# Patient Record
Sex: Female | Born: 1990 | Race: White | Hispanic: No | Smoking: Never smoker
Health system: Southern US, Community
[De-identification: ages and names within clinical notes are randomized; demographics above are authoritative.]

## PROBLEM LIST (undated history)

## (undated) DIAGNOSIS — Q740 Other congenital malformations of upper limb(s), including shoulder girdle: Secondary | ICD-10-CM

## (undated) DIAGNOSIS — K219 Gastro-esophageal reflux disease without esophagitis: Secondary | ICD-10-CM

## (undated) DIAGNOSIS — F419 Anxiety disorder, unspecified: Secondary | ICD-10-CM

## (undated) DIAGNOSIS — F32A Depression, unspecified: Secondary | ICD-10-CM

## (undated) DIAGNOSIS — E119 Type 2 diabetes mellitus without complications: Secondary | ICD-10-CM

## (undated) DIAGNOSIS — M1990 Unspecified osteoarthritis, unspecified site: Secondary | ICD-10-CM

## (undated) DIAGNOSIS — D649 Anemia, unspecified: Secondary | ICD-10-CM

## (undated) DIAGNOSIS — Z5189 Encounter for other specified aftercare: Secondary | ICD-10-CM

## (undated) HISTORY — PX: TOE SURGERY: SHX1073

## (undated) HISTORY — DX: Type 2 diabetes mellitus without complications: E11.9

## (undated) HISTORY — DX: Encounter for other specified aftercare: Z51.89

## (undated) HISTORY — DX: Anxiety disorder, unspecified: F41.9

## (undated) HISTORY — PX: TUBAL LIGATION: SHX77

## (undated) HISTORY — DX: Depression, unspecified: F32.A

## (undated) HISTORY — PX: HAND SURGERY: SHX662

## (undated) HISTORY — DX: Gastro-esophageal reflux disease without esophagitis: K21.9

## (undated) HISTORY — DX: Unspecified osteoarthritis, unspecified site: M19.90

## (undated) HISTORY — PX: COLONOSCOPY: SHX174

## (undated) HISTORY — PX: ESOPHAGOGASTRODUODENOSCOPY: SHX1529

---
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
## 2017-10-31 DIAGNOSIS — D509 Iron deficiency anemia, unspecified: Secondary | ICD-10-CM | POA: Insufficient documentation

## 2018-05-21 ENCOUNTER — Other Ambulatory Visit: Payer: Self-pay

## 2018-05-21 ENCOUNTER — Ambulatory Visit (HOSPITAL_BASED_OUTPATIENT_CLINIC_OR_DEPARTMENT_OTHER)
Admission: RE | Admit: 2018-05-21 | Discharge: 2018-05-21 | Disposition: A | Discharge status: Home / Self Care | Payer: BLUE CROSS/BLUE SHIELD | Source: Ambulatory Visit | Attending: Family Medicine | Admitting: Family Medicine

## 2018-05-21 ENCOUNTER — Encounter (HOSPITAL_BASED_OUTPATIENT_CLINIC_OR_DEPARTMENT_OTHER): Payer: Self-pay

## 2018-05-21 ENCOUNTER — Encounter: Payer: Self-pay | Admitting: *Deleted

## 2018-05-21 ENCOUNTER — Telehealth: Payer: Self-pay | Admitting: *Deleted

## 2018-05-21 ENCOUNTER — Emergency Department (INDEPENDENT_AMBULATORY_CARE_PROVIDER_SITE_OTHER)
Admission: EM | Admit: 2018-05-21 | Discharge: 2018-05-21 | Disposition: A | Discharge status: Home / Self Care | Payer: BLUE CROSS/BLUE SHIELD | Source: Home / Self Care | Attending: Family Medicine | Admitting: Family Medicine

## 2018-05-21 DIAGNOSIS — B9689 Other specified bacterial agents as the cause of diseases classified elsewhere: Secondary | ICD-10-CM

## 2018-05-21 DIAGNOSIS — R1031 Right lower quadrant pain: Secondary | ICD-10-CM | POA: Diagnosis not present

## 2018-05-21 DIAGNOSIS — N760 Acute vaginitis: Secondary | ICD-10-CM | POA: Diagnosis not present

## 2018-05-21 DIAGNOSIS — B3731 Acute candidiasis of vulva and vagina: Secondary | ICD-10-CM

## 2018-05-21 DIAGNOSIS — B373 Candidiasis of vulva and vagina: Secondary | ICD-10-CM | POA: Diagnosis not present

## 2018-05-21 DIAGNOSIS — D251 Intramural leiomyoma of uterus: Secondary | ICD-10-CM | POA: Diagnosis not present

## 2018-05-21 DIAGNOSIS — K219 Gastro-esophageal reflux disease without esophagitis: Secondary | ICD-10-CM | POA: Diagnosis not present

## 2018-05-21 HISTORY — DX: Other congenital malformations of upper limb(s), including shoulder girdle: Q74.0

## 2018-05-21 HISTORY — DX: Anemia, unspecified: D64.9

## 2018-05-21 LAB — POCT URINALYSIS DIP (MANUAL ENTRY)
Bilirubin, UA: NEGATIVE
Blood, UA: NEGATIVE
Glucose, UA: NEGATIVE mg/dL
Ketones, POC UA: NEGATIVE mg/dL
Nitrite, UA: NEGATIVE
Protein Ur, POC: NEGATIVE mg/dL
Spec Grav, UA: >=1.03 — AB
Urobilinogen, UA: 0.2 U/dL
pH, UA: 5

## 2018-05-21 LAB — POCT CBC W AUTO DIFF (K'VILLE URGENT CARE)

## 2018-05-21 LAB — POCT URINE PREGNANCY: Preg Test, Ur: NEGATIVE

## 2018-05-21 MED ORDER — METRONIDAZOLE 500 MG PO TABS: ORAL_TABLET | ORAL | 0 refills | Status: DC

## 2018-05-21 MED ORDER — FLUCONAZOLE 150 MG PO TABS: ORAL_TABLET | ORAL | 1 refills | Status: DC

## 2018-05-21 NOTE — Discharge Instructions (Addendum)
May take Pepcid [facility] or Prilosec OTC daily for reflux symptoms.

## 2018-05-21 NOTE — ED Triage Notes (Signed)
Pt c/o RLQ abd pain since her last period and  amenorrhea x 11 days. She also c/o vaginal itching and heart burn. Denies concern for STD and denies dysuria.

## 2018-05-21 NOTE — Telephone Encounter (Signed)
Attempted to call with [place] results. NA and VM is not set up. Per Dr. Assunta Found [place] did not show anything acute to explain her s/s and her WBC was normal. Showed small fibroid in the Uterus. She should f/u with her PCP for further evaluation of her s/s.

## 2018-05-21 NOTE — ED Provider Notes (Signed)
[facility]    CSN: [id number] Arrival date & time: 05/21/18  0859     History   Chief Complaint Chief Complaint  Patient presents with  . Abdominal Pain  . Amenorrhea    HPI Jill Butler is a 28 y.o. female.   Patient presents with several problems: 1)  She complains of right lower quadrant pain present for about 11 days.  Her last normal period was on 04/13/18, and lasted 6 days.  At the beginning of her last period she developed right lower quadrant pain radiating to her right flank with abdominal bloating.  The pain gradually decreased to a lower intensity as her period resolved, but has remained constant.  Her next period, which did not occur, was due 11 days ago.  However, at that time her abdominal pain increased to a similar intensity as in December, then gradually decreased to a constant low level.  The pain is worse during physical activity, and awakens her at night.  She denies urinary symptoms.  There have been no changes in her bowel movements.  She denies vaginal discharge. She denies fevers, chills, and sweats, and does not feel ill. She has had little improvement with Motrin and Tylenol. She has a history of bilateral tubal ligation. 2)  Patient complains of increased heartburn for 3 months.  Her symptoms consist of frequent "burping" and upper esophageal discomfort.  She admits that she awakens with a cough.  Her symptoms improve slightly with Tums. 3)  She complains of vaginal itching without discharge or external rash.  She states that she was treated for a vaginal yeast infection 3 months ago.  The history is provided by the patient.  Abdominal Pain  Pain location:  RLQ Pain quality: aching, bloating and pressure   Pain radiates to:  R flank Pain severity:  Mild Onset quality:  Gradual Duration:  4 weeks Timing:  Constant Progression:  Waxing and waning Chronicity:  Recurrent Context: awakening from sleep and previous surgery   Context: not  alcohol use, not diet changes, not eating, not medication withdrawal, not recent illness, not recent travel, not sick contacts, not suspicious food intake and not trauma   Relieved by:  Nothing Worsened by:  Movement, palpation and coughing Ineffective treatments:  NSAIDs and acetaminophen Associated symptoms: belching   Associated symptoms: no anorexia, no chest pain, no chills, no constipation, no cough, no diarrhea, no dysuria, no fatigue, no fever, no flatus, no hematemesis, no hematochezia, no hematuria, no melena, no nausea, no shortness of breath, no sore throat, no vaginal bleeding, no vaginal discharge and no vomiting   Risk factors: multiple surgeries and obesity   Risk factors: no alcohol abuse and not pregnant     Past Medical History:  Diagnosis Date  . Anemia   . Congenital clinodactyly     Patient Active Problem List   Diagnosis Date Noted  . Iron deficiency anemia, unspecified 10/31/2017  . 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 CESAREAN SECTION     x 2  . COLONOSCOPY    . ESOPHAGOGASTRODUODENOSCOPY    . HAND SURGERY Left    3rd digit  . TOE SURGERY Right    Right great toe  . TUBAL LIGATION      OB History    Gravida  2   Para  2   Term  2   Preterm      AB      Living  2     SAB  0   TAB  0   Ectopic  0   Multiple  0   Live Births  2            Home Medications    Prior to Admission medications   Medication Sig Start Date End Date Taking? Authorizing Provider  fluconazole (DIFLUCAN) 150 MG tablet Take one tab by mouth as a single dose.  May repeat in 72 hours if needed. 05/21/18   Kandra Nicolas, MD  metroNIDAZOLE (FLAGYL) 500 MG tablet Take one tab by mouth every 12 hours for 7 days. 05/21/18   Kandra Nicolas, MD    Family History Family History  Problem Relation Age of Onset  . Thyroid disease Mother   . Scoliosis Mother   . Diabetes Father   . Hyperlipidemia Father    . Hypertension Father     Social History Social History   Tobacco Use  . Smoking status: Never Smoker  . Smokeless tobacco: Never Used  Substance Use Topics  . Alcohol use: Never    Frequency: Never  . Drug use: Never     Allergies   Meloxicam; Norethindron-ethinyl estrad-fe; Other; Sulfa antibiotics; Sulfamethoxazole; and Tramadol   Review of Systems Review of Systems  Constitutional: Negative for activity change, appetite change, chills, diaphoresis, fatigue, fever and unexpected weight change.  HENT: Negative for congestion and sore throat.   Eyes: Negative.   Respiratory: Negative for cough and shortness of breath.   Cardiovascular: Negative for chest pain and leg swelling.  Gastrointestinal: Positive for abdominal pain. Negative for anorexia, blood in stool, constipation, diarrhea, flatus, hematemesis, hematochezia, melena, nausea and vomiting.       Heartburn  Genitourinary: Negative for dysuria, frequency, genital sores, hematuria, pelvic pain, urgency, vaginal bleeding and vaginal discharge.  Musculoskeletal: Negative.   Skin: Negative.   Neurological: Negative for headaches.  Hematological: Negative.      Physical Exam Triage Vital Signs ED Triage Vitals [05/21/18 0940]  Enc Vitals Group     BP 117/75     Pulse Rate 67     Resp 18     Temp 98 F (36.7 C)     Temp Source Oral     SpO2 99 %     Weight 204 lb (92.5 kg)     Height 5' (1.524 m)     Head Circumference      Peak Flow      Pain Score 0     Pain Loc      Pain Edu?      Excl. in [place]?    No data found.  Updated Vital Signs BP 117/75 (BP Location: Right Arm)   Pulse 67   Temp 98 F (36.7 C) (Oral)   Resp 18   Ht 5' (1.524 m)   Wt 92.5 kg   LMP 04/13/2018   SpO2 99%   BMI 39.84 kg/m   Visual Acuity Right Eye Distance:   Left Eye Distance:   Bilateral Distance:    Right Eye Near:   Left Eye Near:    Bilateral Near:     Physical Exam Vitals signs and nursing note reviewed.   Constitutional:      General: She is not in acute distress.    Appearance: She is obese. She is not ill-appearing.  HENT:     Head: Normocephalic.     Mouth/Throat:     Mouth: Mucous membranes are moist.  Eyes:  Extraocular Movements: Extraocular movements intact.  Neck:     Musculoskeletal: Neck supple.  Cardiovascular:     Heart sounds: Normal heart sounds.  Pulmonary:     Breath sounds: Normal breath sounds.  Abdominal:     General: Abdomen is protuberant. Bowel sounds are normal. There is no distension.     Palpations: Abdomen is soft. There is no hepatomegaly or splenomegaly.     Tenderness: There is abdominal tenderness in the right lower quadrant. There is no right CVA tenderness, guarding or rebound. Positive signs include McBurney's sign. Negative signs include psoas sign and obturator sign.     Hernia: No hernia is present.    Musculoskeletal:     Right lower leg: No edema.     Left lower leg: No edema.  Lymphadenopathy:     Cervical: No cervical adenopathy.  Skin:    General: Skin is warm and dry.     Findings: No rash.  Neurological:     Mental Status: She is alert and oriented to person, place, and time.      UC Treatments / Results  Labs (all labs ordered are listed, but only abnormal results are displayed) Labs Reviewed  POCT URINALYSIS DIP (MANUAL ENTRY) - Abnormal; Notable for the following components:      Result Value   Clarity, UA cloudy (*)    Spec Grav, UA >=1.030 (*)    Leukocytes, UA Small (1+) (*)    All other components within normal limits  POCT WET + KOH PREP:  Negative trich, negative WBC, Positive yeast, epithelial cells present, Positive clue cells  POCT URINE PREGNANCY Negative  POCT CBC W AUTO DIFF ([facility]):  WBC 7.4; LY 33.6; MO 1.6; GR 64.8; Hgb 14.6; Platelets 284     EKG None  Radiology No results found.  Procedures Procedures (including critical care time)  Medications Ordered in UC Medications - No  data to display  Initial Impression / Assessment and Plan / UC Course  I have reviewed the triage vital signs and the nursing notes.  Pertinent labs & imaging results that were available during my care of the patient were reviewed by me and considered in my medical decision making (see chart for details).    Normal CBC, and negative pregnancy test reassuring.  Acute appendicitis unlikely. Schedule pelvic u/s Rx Flagyl and single dose Diflucan  Followup with PCP  Final Clinical Impressions(s) / UC Diagnoses   Final diagnoses:  Gastroesophageal reflux disease, esophagitis presence not specified  BV (bacterial vaginosis)  Candida vaginitis  Right lower quadrant abdominal pain     Discharge Instructions     May take Pepcid [facility] or Prilosec OTC daily for reflux symptoms.    ED Prescriptions    Medication Sig Dispense Auth. Provider   metroNIDAZOLE (FLAGYL) 500 MG tablet Take one tab by mouth every 12 hours for 7 days. 14 tablet Kandra Nicolas, MD   fluconazole (DIFLUCAN) 150 MG tablet Take one tab by mouth as a single dose.  May repeat in 72 hours if needed. 1 tablet Kandra Nicolas, MD         Kandra Nicolas, MD 05/21/18 [phone]

## 2018-05-22 NOTE — Telephone Encounter (Signed)
Patient informed of u/s results.  Patient voices understanding.

## 2018-08-23 DIAGNOSIS — F411 Generalized anxiety disorder: Secondary | ICD-10-CM | POA: Insufficient documentation

## 2018-08-23 DIAGNOSIS — F332 Major depressive disorder, recurrent severe without psychotic features: Secondary | ICD-10-CM | POA: Insufficient documentation

## 2018-08-24 DIAGNOSIS — E119 Type 2 diabetes mellitus without complications: Secondary | ICD-10-CM | POA: Insufficient documentation

## 2018-08-24 DIAGNOSIS — Z6838 Body mass index (BMI) 38.0-38.9, adult: Secondary | ICD-10-CM | POA: Insufficient documentation

## 2018-08-25 ENCOUNTER — Ambulatory Visit (HOSPITAL_COMMUNITY): Payer: BLUE CROSS/BLUE SHIELD | Admitting: Psychiatry

## 2018-08-25 MED ORDER — PAROXETINE HCL 20 MG PO TABS
20.00 | ORAL_TABLET | ORAL | Status: DC
Start: 2018-08-25 — End: 2018-08-25

## 2018-08-25 MED ORDER — SALINE NASAL SPRAY 0.65 % NA SOLN
2.00 | NASAL | Status: DC
Start: ? — End: 2018-08-25

## 2018-08-25 MED ORDER — DEXTROMETHORPHAN-GUAIFENESIN 10-100 MG/5ML PO SYRP
10.00 | ORAL_SOLUTION | ORAL | Status: DC
Start: ? — End: 2018-08-25

## 2018-08-25 MED ORDER — LOPERAMIDE HCL 2 MG PO CAPS
2.00 | ORAL_CAPSULE | ORAL | Status: DC
Start: ? — End: 2018-08-25

## 2018-08-25 MED ORDER — POLYVINYL ALCOHOL-POVIDONE PF 1.4-0.6 % OP SOLN
2.00 | OPHTHALMIC | Status: DC
Start: ? — End: 2018-08-25

## 2018-08-25 MED ORDER — NITROFURANTOIN MONOHYD MACRO 100 MG PO CAPS
100.00 | ORAL_CAPSULE | ORAL | Status: DC
Start: 2018-08-25 — End: 2018-08-25

## 2018-08-25 MED ORDER — ANTACID 311-232 MG OR CAPS
1.00 | ORAL_CAPSULE | ORAL | Status: DC
Start: ? — End: 2018-08-25

## 2018-08-25 MED ORDER — THERA PO TABS
1.00 | ORAL_TABLET | ORAL | Status: DC
Start: 2018-08-25 — End: 2018-08-25

## 2018-08-25 MED ORDER — ALUM & MAG HYDROXIDE-SIMETH 200-200-20 MG/5ML PO SUSP
30.00 | ORAL | Status: DC
Start: ? — End: 2018-08-25

## 2018-08-25 MED ORDER — GENERIC EXTERNAL MEDICATION
Status: DC
Start: ? — End: 2018-08-25

## 2018-08-25 MED ORDER — HYDROXYZINE PAMOATE 50 MG PO CAPS
50.00 | ORAL_CAPSULE | ORAL | Status: DC
Start: ? — End: 2018-08-25

## 2018-08-25 MED ORDER — POLYETHYLENE GLYCOL 3350 17 G PO PACK
17.00 | PACK | ORAL | Status: DC
Start: ? — End: 2018-08-25

## 2018-08-25 MED ORDER — QUINERVA 260 MG PO TABS
650.00 | ORAL_TABLET | ORAL | Status: DC
Start: ? — End: 2018-08-25

## 2018-09-01 ENCOUNTER — Ambulatory Visit (INDEPENDENT_AMBULATORY_CARE_PROVIDER_SITE_OTHER): Payer: BLUE CROSS/BLUE SHIELD | Admitting: Psychiatry

## 2018-09-01 ENCOUNTER — Encounter (HOSPITAL_COMMUNITY): Payer: Self-pay | Admitting: Psychiatry

## 2018-09-01 VITALS — Ht 60.0 in | Wt 206.0 lb

## 2018-09-01 DIAGNOSIS — F411 Generalized anxiety disorder: Secondary | ICD-10-CM | POA: Diagnosis not present

## 2018-09-01 DIAGNOSIS — F331 Major depressive disorder, recurrent, moderate: Secondary | ICD-10-CM | POA: Diagnosis not present

## 2018-09-01 NOTE — Progress Notes (Signed)
Psychiatric Initial Adult Assessment   Patient Identification: Jill Butler MRN:  [id number] Date of Evaluation:  09/01/2018 Referral Source: Jill Butler Chief Complaint:   Chief Complaint    Depression; Anxiety; Establish Care     Visit Diagnosis:    ICD-10-CM   1. Major depressive disorder, recurrent episode, moderate (HCC) F33.1   2. GAD (generalized anxiety disorder) F41.1    I connected with Jill Butler on 09/01/18 at 11:00 AM EDT by a video enabled telemedicine application and verified that I am speaking with the correct person using two identifiers.   I discussed the limitations of evaluation and management by telemedicine and the availability of in person appointments. The patient expressed understanding and agreed to proceed.   History of Present Illness: Jill Butler is a 28 years old currently married Caucasian female she is a hospital discharge from [facility] hospital last week.  She is currently living with her parents her husband niece and 2 kids  Prior to hospitalization she was feeling down depressed hopeless her medication of Celexa she felt Celexa made her have negative thoughts increased nightmares some hallucinations as if something bad can happen or she may die the thoughts were getting worse and added to the anxiety of the pandemic and her husband being sick with schizophrenia and having a cancer.  She got to the point that she needed to be hospitalized  In the hospital her medication was changed to Paxil now she is a dose of 20 mg she feels comfortable she is not endorsing hearing voices she is not endorsing depression or hopelessness or anxiety is also improved prior to hospital admission she was also stressed about her work she has been off work for the last 3 weeks her work was getting more than 40 hours and it was getting stressful she would worry about her kids and about her husband when she was at work she was not able to cut down the work hours and that added  stress  There is no associated psychotic symptoms as of now there is no prior psychotic symptoms she felt her depression got worse and her negative thoughts were getting worse.  There is no clear manic symptoms.  She is not endorsing any panic symptoms or excessive worries she feels her worries are regular related to pandemic finances.  She feels the Paxil is helping there is no reported side effects she is not having nightmares either sleep hours have been better she wants to go back to work and she feels comfortable as her depression is nearly resolved and she is not excessively stressed  Aggravating factors; finances job stress recent pandemic husband sick husband has schizophrenia 2 kids.  Modifying factors her kids.  She does like her job and wants to get back on the regular hours.  Her parents No prior trauma  No prior history of abuse or using drugs  Duration 1-1/2 to 2 years postpartum she had to use Zoloft it did not help it made her numb she stopped it and recently because of depression anxiety she was started on Celexa prior to admission   Past Psychiatric History: depression  Previous Psychotropic Medications: Yes   Substance Abuse History in the last 12 months:  No.  Consequences of Substance Abuse: NA  Past Medical History:  Past Medical History:  Diagnosis Date  . Anemia   . Congenital clinodactyly     Past Surgical History:  Procedure Laterality Date  . CESAREAN SECTION     x 2  .  COLONOSCOPY    . ESOPHAGOGASTRODUODENOSCOPY    . HAND SURGERY Left    3rd digit  . TOE SURGERY Right    Right great toe  . TUBAL LIGATION      Family Psychiatric History: brother : bipolar  Family History:  Family History  Problem Relation Age of Onset  . Thyroid disease Mother   . Scoliosis Mother   . Diabetes Father   . Hyperlipidemia Father   . Hypertension Father     Social History:   Social History   Socioeconomic History  . Marital status: Married     Spouse name: Not on file  . Number of children: Not on file  . Years of education: Not on file  . Highest education level: Not on file  Occupational History  . Not on file  Social Needs  . Financial resource strain: Not on file  . Food insecurity:    Worry: Not on file    Inability: Not on file  . Transportation needs:    Medical: Not on file    Non-medical: Not on file  Tobacco Use  . Smoking status: Never Smoker  . Smokeless tobacco: Never Used  Substance and Sexual Activity  . Alcohol use: Never    Frequency: Never  . Drug use: Never  . Sexual activity: Yes    Partners: Male    Birth control/protection: Surgical    Comment: tubal lig  Lifestyle  . Physical activity:    Days per week: Not on file    Minutes per session: Not on file  . Stress: Not on file  Relationships  . Social connections:    Talks on phone: Not on file    Gets together: Not on file    Attends religious service: Not on file    Active member of club or organization: Not on file    Attends meetings of clubs or organizations: Not on file    Relationship status: Not on file  Other Topics Concern  . Not on file  Social History Narrative  . Not on file    Additional Social History: She grew up with her parents no trauma.  Finished Warehouse manager,  Married for 2 years.has 2 kids  Allergies:   Allergies  Allergen Reactions  . Meloxicam Hives and Nausea Only  . Norethindron-Ethinyl Estrad-Fe Hives and Rash  . Other Swelling    rasberry  . Sulfa Antibiotics Hives and Swelling  . Sulfamethoxazole Rash  . Tramadol Nausea Only    Metabolic Disorder Labs: No results found for: HGBA1C, MPG No results found for: PROLACTIN No results found for: CHOL, TRIG, HDL, CHOLHDL, VLDL, LDLCALC No results found for: TSH  Therapeutic Level Labs: No results found for: LITHIUM No results found for: CBMZ No results found for: VALPROATE  Current Medications: Current Outpatient Medications  Medication Sig  Dispense Refill  . PARoxetine (PAXIL) 20 MG tablet Take by mouth.    . fluconazole (DIFLUCAN) 150 MG tablet Take one tab by mouth as a single dose.  May repeat in 72 hours if needed. 1 tablet 1  . metroNIDAZOLE (FLAGYL) 500 MG tablet Take one tab by mouth every 12 hours for 7 days. 14 tablet 0   No current facility-administered medications for this visit.     No lean or restlessness  Psychiatric Specialty Exam: Review of Systems  Cardiovascular: Negative for chest pain.  Skin: Negative for rash.  Psychiatric/Behavioral: Negative for depression, substance abuse and suicidal ideas.    Height  5' (1.524 m), weight 206 lb (93.4 kg).Body mass index is 40.23 kg/m.  General Appearance: Casual  Eye Contact:  Fair  Speech:  Normal Rate  Volume:  Normal  Mood:  Euthymic  Affect:  Congruent  Thought Process:  Goal Directed  Orientation:  Full (Time, Place, and Person)  Thought Content:  Logical  Suicidal Thoughts:  No  Homicidal Thoughts:  No  Memory:  Immediate;   Fair Recent;   Fair  Judgement:  Fair  Insight:  Fair  Psychomotor Activity:  Normal  Concentration:  Concentration: Fair and Attention Span: Fair  Recall:  [employer] of Knowledge:Fair  Language: Fair  Akathisia:  No  Handed:  Right  AIMS (if indicated):  not done  Assets:  Desire for Improvement  ADL's: normal  Cognition: WNL  Sleep:  Fair   Screenings:   Assessment and Plan: as follows MDD recurrent moderate: improved. Continue paxil. Denies negative toughts or nightmares GAD: improved. Continue paxil.has meds  Can re join work by Friday. Will write down letter as she need and be faxed to [facility]  I discussed the assessment and treatment plan with the patient. The patient was provided an opportunity to ask questions and all were answered. The patient agreed with the plan and demonstrated an understanding of the instructions.   The patient was advised to call back or seek an in-person evaluation  if the symptoms worsen or if the condition fails to improve as anticipated.  I provided 50 minutes of non-face-to-face time during this encounter.  Fu 3 weeks  Merian Capron, MD 5/13/202011:36 AM

## 2018-09-29 ENCOUNTER — Ambulatory Visit (INDEPENDENT_AMBULATORY_CARE_PROVIDER_SITE_OTHER): Payer: BC Managed Care – PPO | Admitting: Psychiatry

## 2018-09-29 ENCOUNTER — Encounter (HOSPITAL_COMMUNITY): Payer: Self-pay | Admitting: Psychiatry

## 2018-09-29 DIAGNOSIS — F411 Generalized anxiety disorder: Secondary | ICD-10-CM | POA: Diagnosis not present

## 2018-09-29 DIAGNOSIS — F331 Major depressive disorder, recurrent, moderate: Secondary | ICD-10-CM

## 2018-09-29 MED ORDER — PAROXETINE HCL 20 MG PO TABS: 20.0000 mg | ORAL_TABLET | Freq: Every day | ORAL | 2 refills | Status: DC

## 2018-09-29 NOTE — Progress Notes (Signed)
[place] Follow up   Patient Identification: Jill Butler MRN:  [id number] Date of Evaluation:  09/29/2018 Referral Source: [facility] Chief Complaint:   depression follow up Visit Diagnosis:    ICD-10-CM   1. Major depressive disorder, recurrent episode, moderate (HCC) F33.1   2. GAD (generalized anxiety disorder) F41.1     I connected with Jill Butler on 09/29/18 at  9:30 AM EDT by a video enabled telemedicine application and verified that I am speaking with the correct person using two identifiers.   I discussed the limitations of evaluation and management by telemedicine and the availability of in person appointments. The patient expressed understanding and agreed to proceed.   History of Present Illness: Jill Butler is a 28 years old currently married Caucasian female her initial appointment was after  hospital discharge from [place] for depression which was getting worse on celexa   She is doing fairly well on paxil. Working at [employer]. Good support at home Her husband being sick with schizophrenia and having a cancer.  That was stressfu Now doing better  Less anxious, more positive Aggravating factors; finances job stress recent pandemic husband sick husband has schizophrenia 2 kids.  Modifying factors her kids.  She does like her job and wants to get back on the regular hours.  Her parents No prior trauma  No prior history of abuse or using drugs    Past Psychiatric History: depression  Previous Psychotropic Medications: Yes   Substance Abuse History in the last 12 months:  No.  Consequences of Substance Abuse: NA  Past Medical History:  Past Medical History:  Diagnosis Date  . Anemia   . Congenital clinodactyly     Past Surgical History:  Procedure Laterality Date  . CESAREAN SECTION     x 2  . COLONOSCOPY    . ESOPHAGOGASTRODUODENOSCOPY    . HAND SURGERY Left    3rd digit  . TOE SURGERY Right    Right great toe  . TUBAL LIGATION       Family Psychiatric History: brother : bipolar  Family History:  Family History  Problem Relation Age of Onset  . Thyroid disease Mother   . Scoliosis Mother   . Diabetes Father   . Hyperlipidemia Father   . Hypertension Father     Social History:   Social History   Socioeconomic History  . Marital status: Married    Spouse name: Not on file  . Number of children: Not on file  . Years of education: Not on file  . Highest education level: Not on file  Occupational History  . Not on file  Social Needs  . Financial resource strain: Not on file  . Food insecurity:    Worry: Not on file    Inability: Not on file  . Transportation needs:    Medical: Not on file    Non-medical: Not on file  Tobacco Use  . Smoking status: Never Smoker  . Smokeless tobacco: Never Used  Substance and Sexual Activity  . Alcohol use: Never    Frequency: Never  . Drug use: Never  . Sexual activity: Yes    Partners: Male    Birth control/protection: Surgical    Comment: tubal lig  Lifestyle  . Physical activity:    Days per week: Not on file    Minutes per session: Not on file  . Stress: Not on file  Relationships  . Social connections:    Talks on phone: Not on file  Gets together: Not on file    Attends religious service: Not on file    Active member of club or organization: Not on file    Attends meetings of clubs or organizations: Not on file    Relationship status: Not on file  Other Topics Concern  . Not on file  Social History Narrative  . Not on file      Allergies:   Allergies  Allergen Reactions  . Meloxicam Hives and Nausea Only  . Norethindron-Ethinyl Estrad-Fe Hives and Rash  . Other Swelling    rasberry  . Sulfa Antibiotics Hives and Swelling  . Sulfamethoxazole Rash  . Tramadol Nausea Only    Metabolic Disorder Labs: No results found for: HGBA1C, MPG No results found for: PROLACTIN No results found for: CHOL, TRIG, HDL, CHOLHDL, VLDL, LDLCALC No  results found for: TSH  Therapeutic Level Labs: No results found for: LITHIUM No results found for: CBMZ No results found for: VALPROATE  Current Medications: Current Outpatient Medications  Medication Sig Dispense Refill  . fluconazole (DIFLUCAN) 150 MG tablet Take one tab by mouth as a single dose.  May repeat in 72 hours if needed. 1 tablet 1  . metroNIDAZOLE (FLAGYL) 500 MG tablet Take one tab by mouth every 12 hours for 7 days. 14 tablet 0  . PARoxetine (PAXIL) 20 MG tablet Take 1 tablet (20 mg total) by mouth daily. 30 tablet 2   No current facility-administered medications for this visit.     No lean or restlessness  Psychiatric Specialty Exam: Review of Systems  Cardiovascular: Negative for chest pain and palpitations.  Skin: Negative for rash.  Psychiatric/Behavioral: Negative for depression, substance abuse and suicidal ideas.    There were no vitals taken for this visit.There is no height or weight on file to calculate BMI.  General Appearance: Casual  Eye Contact:  Fair  Speech:  Normal Rate  Volume:  Normal  Mood: better  Affect:  Congruent  Thought Process:  Goal Directed  Orientation:  Full (Time, Place, and Person)  Thought Content:  Logical  Suicidal Thoughts:  No  Homicidal Thoughts:  No  Memory:  Immediate;   Fair Recent;   Fair  Judgement:  Fair  Insight:  Fair  Psychomotor Activity:  Normal  Concentration:  Concentration: Fair and Attention Span: Fair  Recall:  [employer] of Knowledge:Fair  Language: Fair  Akathisia:  No  Handed:  Right  AIMS (if indicated):  not done  Assets:  Desire for Improvement  ADL's: normal  Cognition: WNL  Sleep:  Fair   Screenings:   Assessment and Plan: as follows MDD recurrent moderate:improved, conitnue paxil GAD: improved. Continue paxil  Refills sent  I discussed the assessment and treatment plan with the patient. The patient was provided an opportunity to ask questions and all were answered. The  patient agreed with the plan and demonstrated an understanding of the instructions.   The patient was advised to call back or seek an in-person evaluation if the symptoms worsen or if the condition fails to improve as anticipated.  I provided 15 minutes of non-face-to-face time during this encounter.  Fu 3 months Merian Capron, MD 6/10/20209:36 AM

## 2018-12-14 ENCOUNTER — Other Ambulatory Visit (HOSPITAL_COMMUNITY): Payer: Self-pay | Admitting: Psychiatry

## 2018-12-28 ENCOUNTER — Ambulatory Visit (INDEPENDENT_AMBULATORY_CARE_PROVIDER_SITE_OTHER): Payer: BC Managed Care – PPO | Admitting: Psychiatry

## 2018-12-28 ENCOUNTER — Encounter (HOSPITAL_COMMUNITY): Payer: Self-pay | Admitting: Psychiatry

## 2018-12-28 DIAGNOSIS — F411 Generalized anxiety disorder: Secondary | ICD-10-CM

## 2018-12-28 DIAGNOSIS — F331 Major depressive disorder, recurrent, moderate: Secondary | ICD-10-CM

## 2018-12-28 MED ORDER — PAROXETINE HCL 30 MG PO TABS: 30.0000 mg | ORAL_TABLET | Freq: Every day | ORAL | 1 refills | Status: DC

## 2018-12-28 NOTE — Progress Notes (Signed)
[place] Follow up   Patient Identification: Jill Butler MRN:  [id number] Date of Evaluation:  12/28/2018 Referral Source: [facility] Chief Complaint:   depression follow up Visit Diagnosis:    ICD-10-CM   1. Major depressive disorder, recurrent episode, moderate (HCC)  F33.1   2. GAD (generalized anxiety disorder)  F41.1      I connected with Jill Butler on 12/28/18 at  2:00 PM EDT by a video enabled telemedicine application and verified that I am speaking with the correct person using two identifiers.    I discussed the limitations of evaluation and management by telemedicine and the availability of in person appointments. The patient expressed understanding and agreed to proceed.   History of Present Illness: Jill Butler is a 28 years old currently married Caucasian female her initial appointment was after  hospital discharge from [place] for depression which was getting worse on celexa   Husband has schizophrenia but supportive She got hurt at work, on cast of arm for a week Some negative toughts and depression   Aggravating factors; finances job stress recent pandemic husband sick husband has schizophrenia 2 kids.  Modifying factors her kids. . Parents  No prior history of abuse or using drugs    Past Psychiatric History: depression  Previous Psychotropic Medications: Yes   Substance Abuse History in the last 12 months:  No.  Consequences of Substance Abuse: NA  Past Medical History:  Past Medical History:  Diagnosis Date  . Anemia   . Congenital clinodactyly     Past Surgical History:  Procedure Laterality Date  . CESAREAN SECTION     x 2  . COLONOSCOPY    . ESOPHAGOGASTRODUODENOSCOPY    . HAND SURGERY Left    3rd digit  . TOE SURGERY Right    Right great toe  . TUBAL LIGATION      Family Psychiatric History: brother : bipolar  Family History:  Family History  Problem Relation Age of Onset  . Thyroid disease Mother   . Scoliosis  Mother   . Diabetes Father   . Hyperlipidemia Father   . Hypertension Father     Social History:   Social History   Socioeconomic History  . Marital status: Married    Spouse name: Not on file  . Number of children: Not on file  . Years of education: Not on file  . Highest education level: Not on file  Occupational History  . Not on file  Social Needs  . Financial resource strain: Not on file  . Food insecurity    Worry: Not on file    Inability: Not on file  . Transportation needs    Medical: Not on file    Non-medical: Not on file  Tobacco Use  . Smoking status: Never Smoker  . Smokeless tobacco: Never Used  Substance and Sexual Activity  . Alcohol use: Never    Frequency: Never  . Drug use: Never  . Sexual activity: Yes    Partners: Male    Birth control/protection: Surgical    Comment: tubal lig  Lifestyle  . Physical activity    Days per week: Not on file    Minutes per session: Not on file  . Stress: Not on file  Relationships  . Social Herbalist on phone: Not on file    Gets together: Not on file    Attends religious service: Not on file    Active member of club or organization: Not  on file    Attends meetings of clubs or organizations: Not on file    Relationship status: Not on file  Other Topics Concern  . Not on file  Social History Narrative  . Not on file      Allergies:   Allergies  Allergen Reactions  . Meloxicam Hives and Nausea Only  . Norethindron-Ethinyl Estrad-Fe Hives and Rash  . Other Swelling    rasberry  . Sulfa Antibiotics Hives and Swelling  . Sulfamethoxazole Rash  . Tramadol Nausea Only    Metabolic Disorder Labs: No results found for: HGBA1C, MPG No results found for: PROLACTIN No results found for: CHOL, TRIG, HDL, CHOLHDL, VLDL, LDLCALC No results found for: TSH  Therapeutic Level Labs: No results found for: LITHIUM No results found for: CBMZ No results found for: VALPROATE  Current  Medications: Current Outpatient Medications  Medication Sig Dispense Refill  . fluconazole (DIFLUCAN) 150 MG tablet Take one tab by mouth as a single dose.  May repeat in 72 hours if needed. 1 tablet 1  . metroNIDAZOLE (FLAGYL) 500 MG tablet Take one tab by mouth every 12 hours for 7 days. 14 tablet 0  . PARoxetine (PAXIL) 30 MG tablet Take 1 tablet (30 mg total) by mouth daily. 30 tablet 1   No current facility-administered medications for this visit.     No lean or restlessness  Psychiatric Specialty Exam: Review of Systems  Cardiovascular: Negative for chest pain and palpitations.  Skin: Negative for rash.  Psychiatric/Behavioral: Negative for substance abuse and suicidal ideas.    There were no vitals taken for this visit.There is no height or weight on file to calculate BMI.  General Appearance: Casual  Eye Contact:  Fair  Speech:  Normal Rate  Volume:  Normal  Mood: fair  Affect:  Congruent  Thought Process:  Goal Directed  Orientation:  Full (Time, Place, and Person)  Thought Content:  Logical  Suicidal Thoughts:  No  Homicidal Thoughts:  No  Memory:  Immediate;   Fair Recent;   Fair  Judgement:  Fair  Insight:  Fair  Psychomotor Activity:  Normal  Concentration:  Concentration: Fair and Attention Span: Fair  Recall:  [employer] of Knowledge:Fair  Language: Fair  Akathisia:  No  Handed:  Right  AIMS (if indicated):  not done  Assets:  Desire for Improvement  ADL's: normal  Cognition: WNL  Sleep:  Fair   Screenings:   Assessment and Plan: as follows MDD recurrent moderate:fair but feels emotional at times and subdued. Increase paxil to 30mg  [id number]. Continue paxil Refills sent  I discussed the assessment and treatment plan with the patient. The patient was provided an opportunity to ask questions and all were answered. The patient agreed with the plan and demonstrated an understanding of the instructions.   The patient was advised to call back or  seek an in-person evaluation if the symptoms worsen or if the condition fails to improve as anticipated.  I provided 15 minutes of non-face-to-face time during this encounter.  Fu 109m. Call for early visit or concerns Merian Capron, MD 9/8/20202:08 PM

## 2019-02-24 ENCOUNTER — Ambulatory Visit (INDEPENDENT_AMBULATORY_CARE_PROVIDER_SITE_OTHER): Payer: BLUE CROSS/BLUE SHIELD | Admitting: Psychiatry

## 2019-02-24 ENCOUNTER — Other Ambulatory Visit: Payer: Self-pay

## 2019-02-24 ENCOUNTER — Encounter (HOSPITAL_COMMUNITY): Payer: Self-pay | Admitting: Psychiatry

## 2019-02-24 DIAGNOSIS — F331 Major depressive disorder, recurrent, moderate: Secondary | ICD-10-CM

## 2019-02-24 DIAGNOSIS — F411 Generalized anxiety disorder: Secondary | ICD-10-CM

## 2019-02-24 NOTE — Progress Notes (Signed)
[place] Follow up   Patient Identification: Jill Butler MRN:  [id number] Date of Evaluation:  02/24/2019 Referral Source: [facility] Chief Complaint:   depression follow up Visit Diagnosis:  No diagnosis found.   I connected with Jill Butler on 02/24/19 at  1:15 PM EST by a video enabled telemedicine application and verified that I am speaking with the correct person using two identifiers.    I discussed the limitations of evaluation and management by telemedicine and the availability of in person appointments. The patient expressed understanding and agreed to proceed.   History of Present Illness: Jill Butler is a 28 years old currently married Caucasian female her initial appointment was after  hospital discharge from [place] for depression which was getting worse on celexa   Husband has schizophrenia but supportive Last visit we increased paxil to 30mg  for depressoin anxiety, did well . Later on changed job and liked this job. Stress was low and she weaned off from paxil some numbness of emotions with med She tapered down and now off for last 2 weeks, doing stable Understands the risk of being without med    Aggravating factors.husband sick husband has schizophrenia 2 kids.  Modifying factors her kids. . Parents. Likes her new job  No prior history of abuse or using drugs    Past Psychiatric History: depression  Previous Psychotropic Medications: Yes   Substance Abuse History in the last 12 months:  No.  Consequences of Substance Abuse: NA  Past Medical History:  Past Medical History:  Diagnosis Date  . Anemia   . Congenital clinodactyly     Past Surgical History:  Procedure Laterality Date  . CESAREAN SECTION     x 2  . COLONOSCOPY    . ESOPHAGOGASTRODUODENOSCOPY    . HAND SURGERY Left    3rd digit  . TOE SURGERY Right    Right great toe  . TUBAL LIGATION      Family Psychiatric History: brother : bipolar  Family History:  Family  History  Problem Relation Age of Onset  . Thyroid disease Mother   . Scoliosis Mother   . Diabetes Father   . Hyperlipidemia Father   . Hypertension Father     Social History:   Social History   Socioeconomic History  . Marital status: Married    Spouse name: Not on file  . Number of children: Not on file  . Years of education: Not on file  . Highest education level: Not on file  Occupational History  . Not on file  Social Needs  . Financial resource strain: Not on file  . Food insecurity    Worry: Not on file    Inability: Not on file  . Transportation needs    Medical: Not on file    Non-medical: Not on file  Tobacco Use  . Smoking status: Never Smoker  . Smokeless tobacco: Never Used  Substance and Sexual Activity  . Alcohol use: Never    Frequency: Never  . Drug use: Never  . Sexual activity: Yes    Partners: Male    Birth control/protection: Surgical    Comment: tubal lig  Lifestyle  . Physical activity    Days per week: Not on file    Minutes per session: Not on file  . Stress: Not on file  Relationships  . Social Herbalist on phone: Not on file    Gets together: Not on file    Attends religious service:  Not on file    Active member of club or organization: Not on file    Attends meetings of clubs or organizations: Not on file    Relationship status: Not on file  Other Topics Concern  . Not on file  Social History Narrative  . Not on file      Allergies:   Allergies  Allergen Reactions  . Meloxicam Hives and Nausea Only  . Norethindron-Ethinyl Estrad-Fe Hives and Rash  . Other Swelling    rasberry  . Sulfa Antibiotics Hives and Swelling  . Sulfamethoxazole Rash  . Tramadol Nausea Only    Metabolic Disorder Labs: No results found for: HGBA1C, MPG No results found for: PROLACTIN No results found for: CHOL, TRIG, HDL, CHOLHDL, VLDL, LDLCALC No results found for: TSH  Therapeutic Level Labs: No results found for: LITHIUM No  results found for: CBMZ No results found for: VALPROATE  Current Medications: Current Outpatient Medications  Medication Sig Dispense Refill  . fluconazole (DIFLUCAN) 150 MG tablet Take one tab by mouth as a single dose.  May repeat in 72 hours if needed. 1 tablet 1  . metroNIDAZOLE (FLAGYL) 500 MG tablet Take one tab by mouth every 12 hours for 7 days. 14 tablet 0  . PARoxetine (PAXIL) 30 MG tablet Take 1 tablet (30 mg total) by mouth daily. 30 tablet 1   No current facility-administered medications for this visit.     No lean or restlessness  Psychiatric Specialty Exam: Review of Systems  Cardiovascular: Negative for chest pain and palpitations.  Skin: Negative for rash.  Psychiatric/Behavioral: Negative for substance abuse and suicidal ideas.    There were no vitals taken for this visit.There is no height or weight on file to calculate BMI.  General Appearance: Casual  Eye Contact:  Fair  Speech:  Normal Rate  Volume:  Normal  Mood: good  Affect:  Congruent  Thought Process:  Goal Directed  Orientation:  Full (Time, Place, and Person)  Thought Content:  Logical  Suicidal Thoughts:  No  Homicidal Thoughts:  No  Memory:  Immediate;   Fair Recent;   Fair  Judgement:  Fair  Insight:  Fair  Psychomotor Activity:  Normal  Concentration:  Concentration: Fair and Attention Span: Fair  Recall:  [employer] of Knowledge:Fair  Language: Fair  Akathisia:  No  Handed:  Right  AIMS (if indicated):  not done  Assets:  Desire for Improvement  ADL's: normal  Cognition: WNL  Sleep:  Fair   Screenings:   Assessment and Plan: as follows MDD recurrent moderate:; stable without meds, paxil stopped herself 2 weeks, ago.has some if need to get started understands the risk but wants to be off meds GAD: better.   I discussed the assessment and treatment plan with the patient. The patient was provided an opportunity to ask questions and all were answered. The patient agreed with the  plan and demonstrated an understanding of the instructions.   The patient was advised to call back or seek an in-person evaluation if the symptoms worsen or if the condition fails to improve as anticipated.  I provided 15 minutes of non-face-to-face time during this encounter.  Fu 85m. Call for early visit or concerns Merian Capron, MD 11/5/20201:21 PM

## 2019-06-01 ENCOUNTER — Ambulatory Visit (INDEPENDENT_AMBULATORY_CARE_PROVIDER_SITE_OTHER): Payer: BC Managed Care – PPO | Admitting: Psychiatry

## 2019-06-01 ENCOUNTER — Encounter (HOSPITAL_COMMUNITY): Payer: Self-pay | Admitting: Psychiatry

## 2019-06-01 DIAGNOSIS — F411 Generalized anxiety disorder: Secondary | ICD-10-CM

## 2019-06-01 DIAGNOSIS — F331 Major depressive disorder, recurrent, moderate: Secondary | ICD-10-CM | POA: Diagnosis not present

## 2019-06-01 NOTE — Progress Notes (Signed)
[place] Follow up   Patient Identification: Jill Butler MRN:  [id number] Date of Evaluation:  06/01/2019 Referral Source: [facility] Chief Complaint:   depression follow up Visit Diagnosis:    ICD-10-CM   1. Major depressive disorder, recurrent episode, moderate (HCC)  F33.1   2. GAD (generalized anxiety disorder)  F41.1      I connected with Jill Butler on 06/01/19 at 11:00 AM EST by a video enabled telemedicine application and verified that I am speaking with the correct person using two identifiers.  I discussed the limitations of evaluation and management by telemedicine and the availability of in person appointments. The patient expressed understanding and agreed to proceed.   History of Present Illness: Jill Butler is a 29 years old currently married Caucasian female her initial appointment was after  hospital discharge from [place] for depression which was getting worse on celexa   Husband has schizophrenia but supportive She has stopped paxil prior to last visit, has been doing fair, working on Radiographer, therapeutic, writes her feelings, has had some down days but does not want to start meds Says she can monitor her symptoms and working with coping skills   Aggravating factors.husband sick husband has schizophrenia 2 kids.  Modifying factors her kids. .parents, Likes her new job  No prior history of abuse or using drugs    Past Psychiatric History: depression  Previous Psychotropic Medications: Yes   Substance Abuse History in the last 12 months:  No.  Consequences of Substance Abuse: NA  Past Medical History:  Past Medical History:  Diagnosis Date  . Anemia   . Congenital clinodactyly     Past Surgical History:  Procedure Laterality Date  . CESAREAN SECTION     x 2  . COLONOSCOPY    . ESOPHAGOGASTRODUODENOSCOPY    . HAND SURGERY Left    3rd digit  . TOE SURGERY Right    Right great toe  . TUBAL LIGATION      Family Psychiatric History:  brother : bipolar  Family History:  Family History  Problem Relation Age of Onset  . Thyroid disease Mother   . Scoliosis Mother   . Diabetes Father   . Hyperlipidemia Father   . Hypertension Father     Social History:   Social History   Socioeconomic History  . Marital status: Married    Spouse name: Not on file  . Number of children: Not on file  . Years of education: Not on file  . Highest education level: Not on file  Occupational History  . Not on file  Tobacco Use  . Smoking status: Never Smoker  . Smokeless tobacco: Never Used  Substance and Sexual Activity  . Alcohol use: Never  . Drug use: Never  . Sexual activity: Yes    Partners: Male    Birth control/protection: Surgical    Comment: tubal lig  Other Topics Concern  . Not on file  Social History Narrative  . Not on file   Social Determinants of Health   Financial Resource Strain:   . Difficulty of Paying Living Expenses: Not on file  Food Insecurity:   . Worried About Charity fundraiser in the Last Year: Not on file  . Ran Out of Food in the Last Year: Not on file  Transportation Needs:   . Lack of Transportation (Medical): Not on file  . Lack of Transportation (Non-Medical): Not on file  Physical Activity:   . Days of Exercise per Week:  Not on file  . Minutes of Exercise per Session: Not on file  Stress:   . Feeling of Stress : Not on file  Social Connections:   . Frequency of Communication with Friends and Family: Not on file  . Frequency of Social Gatherings with Friends and Family: Not on file  . Attends Religious Services: Not on file  . Active Member of Clubs or Organizations: Not on file  . Attends Archivist Meetings: Not on file  . Marital Status: Not on file      Allergies:   Allergies  Allergen Reactions  . Meloxicam Hives and Nausea Only  . Norethindron-Ethinyl Estrad-Fe Hives and Rash  . Other Swelling    rasberry  . Sulfa Antibiotics Hives and Swelling  .  Sulfamethoxazole Rash  . Tramadol Nausea Only    Metabolic Disorder Labs: No results found for: HGBA1C, MPG No results found for: PROLACTIN No results found for: CHOL, TRIG, HDL, CHOLHDL, VLDL, LDLCALC No results found for: TSH  Therapeutic Level Labs: No results found for: LITHIUM No results found for: CBMZ No results found for: VALPROATE  Current Medications: Current Outpatient Medications  Medication Sig Dispense Refill  . fluconazole (DIFLUCAN) 150 MG tablet Take one tab by mouth as a single dose.  May repeat in 72 hours if needed. 1 tablet 1  . metroNIDAZOLE (FLAGYL) 500 MG tablet Take one tab by mouth every 12 hours for 7 days. 14 tablet 0  . PARoxetine (PAXIL) 30 MG tablet Take 1 tablet (30 mg total) by mouth daily. 30 tablet 1   No current facility-administered medications for this visit.    No lean or restlessness  Psychiatric Specialty Exam: Review of Systems  Cardiovascular: Negative for chest pain and palpitations.  Skin: Negative for rash.  Psychiatric/Behavioral: Negative for substance abuse and suicidal ideas.    There were no vitals taken for this visit.There is no height or weight on file to calculate BMI.  General Appearance: Casual  Eye Contact:  Fair  Speech:  Normal Rate  Volume:  Normal  Mood: fair  Affect:  Congruent  Thought Process:  Goal Directed  Orientation:  Full (Time, Place, and Person)  Thought Content:  Logical  Suicidal Thoughts:  No  Homicidal Thoughts:  No  Memory:  Immediate;   Fair Recent;   Fair  Judgement:  Fair  Insight:  Fair  Psychomotor Activity:  Normal  Concentration:  Concentration: Fair and Attention Span: Fair  Recall:  [employer] of Knowledge:Fair  Language: Fair  Akathisia:  No  Handed:  Right  AIMS (if indicated):  not done  Assets:  Desire for Improvement  ADL's: normal  Cognition: WNL  Sleep:  Fair   Screenings:   Assessment and Plan: as follows MDD recurrent moderate:; fair without meds, says  understands the risk and will start if needed but not interested and says working on coping skills and handling it, GAD: fair   I discussed the assessment and treatment plan with the patient. The patient was provided an opportunity to ask questions and all were answered. The patient agreed with the plan and demonstrated an understanding of the instructions.   The patient was advised to call back or seek an in-person evaluation if the symptoms worsen or if the condition fails to improve as anticipated.  I provided 15 minutes of non-face-to-face time during this encounter.  Fu 66m. Call for early visit or concerns Merian Capron, MD 2/10/202111:08 AM

## 2019-08-29 ENCOUNTER — Encounter (HOSPITAL_COMMUNITY): Payer: Self-pay | Admitting: Psychiatry

## 2019-08-29 ENCOUNTER — Telehealth (INDEPENDENT_AMBULATORY_CARE_PROVIDER_SITE_OTHER): Payer: BC Managed Care – PPO | Admitting: Psychiatry

## 2019-08-29 DIAGNOSIS — F331 Major depressive disorder, recurrent, moderate: Secondary | ICD-10-CM

## 2019-08-29 DIAGNOSIS — F411 Generalized anxiety disorder: Secondary | ICD-10-CM

## 2019-08-29 MED ORDER — ESCITALOPRAM OXALATE 5 MG PO TABS: 5.0000 mg | ORAL_TABLET | Freq: Every day | ORAL | 0 refills | Status: DC

## 2019-08-29 NOTE — Progress Notes (Signed)
[place] Follow up   Patient Identification: Jill Butler MRN:  [id number] Date of Evaluation:  08/29/2019 Referral Source: [facility] Chief Complaint:    anxiety follow up  Visit Diagnosis:    ICD-10-CM   1. Major depressive disorder, recurrent episode, moderate (HCC)  F33.1   2. GAD (generalized anxiety disorder)  F41.1    I connected with Jill Butler on 08/29/19 at  3:15 PM EDT by a video enabled telemedicine application and verified that I am speaking with the correct person using two identifiers.   I discussed the limitations of evaluation and management by telemedicine and the availability of in person appointments. The patient expressed understanding and agreed to proceed.   History of Present Illness: Jill Butler is a 29 years old currently married Caucasian female her initial appointment was after  hospital discharge from [place] for depression which was getting worse on celexa  Was doing fair, recently husband diagnosis changed to schizoaffective and DID She is now working 2 jobs Stressed In past has stopped her meds paxil herself saying she was doing fine.  She wants to restart something but not paxil    Aggravating factors.husband sick husband has schizophrenia 2 kids.  Modifying factors her kids. parents, Likes her new job  No prior history of abuse or using drugs  Severity  Anxiety coming back  Past Psychiatric History: depression  Previous Psychotropic Medications: Yes   Substance Abuse History in the last 12 months:  No.  Consequences of Substance Abuse: NA  Past Medical History:  Past Medical History:  Diagnosis Date  . Anemia   . Congenital clinodactyly     Past Surgical History:  Procedure Laterality Date  . CESAREAN SECTION     x 2  . COLONOSCOPY    . ESOPHAGOGASTRODUODENOSCOPY    . HAND SURGERY Left    3rd digit  . TOE SURGERY Right    Right great toe  . TUBAL LIGATION      Family Psychiatric History: brother :  bipolar  Family History:  Family History  Problem Relation Age of Onset  . Thyroid disease Mother   . Scoliosis Mother   . Diabetes Father   . Hyperlipidemia Father   . Hypertension Father     Social History:   Social History   Socioeconomic History  . Marital status: Married    Spouse name: Not on file  . Number of children: Not on file  . Years of education: Not on file  . Highest education level: Not on file  Occupational History  . Not on file  Tobacco Use  . Smoking status: Never Smoker  . Smokeless tobacco: Never Used  Substance and Sexual Activity  . Alcohol use: Never  . Drug use: Never  . Sexual activity: Yes    Partners: Male    Birth control/protection: Surgical    Comment: tubal lig  Other Topics Concern  . Not on file  Social History Narrative  . Not on file   Social Determinants of Health   Financial Resource Strain:   . Difficulty of Paying Living Expenses:   Food Insecurity:   . Worried About Charity fundraiser in the Last Year:   . Arboriculturist in the Last Year:   Transportation Needs:   . Film/video editor (Medical):   Marland Kitchen Lack of Transportation (Non-Medical):   Physical Activity:   . Days of Exercise per Week:   . Minutes of Exercise per Session:   Stress:   .  Feeling of Stress :   Social Connections:   . Frequency of Communication with Friends and Family:   . Frequency of Social Gatherings with Friends and Family:   . Attends Religious Services:   . Active Member of Clubs or Organizations:   . Attends Archivist Meetings:   Marland Kitchen Marital Status:       Allergies:   Allergies  Allergen Reactions  . Meloxicam Hives and Nausea Only  . Norethindron-Ethinyl Estrad-Fe Hives and Rash  . Other Swelling    rasberry  . Sulfa Antibiotics Hives and Swelling  . Sulfamethoxazole Rash  . Tramadol Nausea Only    Metabolic Disorder Labs: No results found for: HGBA1C, MPG No results found for: PROLACTIN No results found for:  CHOL, TRIG, HDL, CHOLHDL, VLDL, LDLCALC No results found for: TSH  Therapeutic Level Labs: No results found for: LITHIUM No results found for: CBMZ No results found for: VALPROATE  Current Medications: Current Outpatient Medications  Medication Sig Dispense Refill  . escitalopram (LEXAPRO) 5 MG tablet Take 1 tablet (5 mg total) by mouth daily. 30 tablet 0  . fluconazole (DIFLUCAN) 150 MG tablet Take one tab by mouth as a single dose.  May repeat in 72 hours if needed. 1 tablet 1  . metroNIDAZOLE (FLAGYL) 500 MG tablet Take one tab by mouth every 12 hours for 7 days. 14 tablet 0   No current facility-administered medications for this visit.    No lean or restlessness  Psychiatric Specialty Exam: Review of Systems  Cardiovascular: Negative for chest pain and palpitations.  Skin: Negative for rash.  Psychiatric/Behavioral: Negative for substance abuse and suicidal ideas. The patient is nervous/anxious.     There were no vitals taken for this visit.There is no height or weight on file to calculate BMI.  General Appearance: Casual  Eye Contact:  Fair  Speech:  Normal Rate  Volume:  Normal  Mood: anxious  Affect:  Congruent  Thought Process:  Goal Directed  Orientation:  Full (Time, Place, and Person)  Thought Content:  Logical  Suicidal Thoughts:  No  Homicidal Thoughts:  No  Memory:  Immediate;   Fair Recent;   Fair  Judgement:  Fair  Insight:  Fair  Psychomotor Activity:  Normal  Concentration:  Concentration: Fair and Attention Span: Fair  Recall:  [employer] of Knowledge:Fair  Language: Fair  Akathisia:  No  Handed:  Right  AIMS (if indicated):  not done  Assets:  Desire for Improvement  ADL's: normal  Cognition: WNL  Sleep:  Fair   Screenings:   Assessment and Plan: as follows MDD recurrent moderate:; somewhat subdued, start lexapro 5mg  small dose and increase If needed [id number] increase anxiety, paxil made her zombie feeling, we will start lexapro 5mg   small dose consider therapy Provided supportive therapy I discussed the assessment and treatment plan with the patient. The patient was provided an opportunity to ask questions and all were answered. The patient agreed with the plan and demonstrated an understanding of the instructions.   The patient was advised to call back or seek an in-person evaluation if the symptoms worsen or if the condition fails to improve as anticipated.  I provided 15 minutes of non-face-to-face time during this encounter.  Fu 32m. . Call for early visit or concerns Merian Capron, MD 5/10/20213:23 PM

## 2019-09-22 ENCOUNTER — Other Ambulatory Visit (HOSPITAL_COMMUNITY): Payer: Self-pay | Admitting: Psychiatry

## 2019-09-29 ENCOUNTER — Encounter (HOSPITAL_COMMUNITY): Payer: Self-pay | Admitting: Psychiatry

## 2019-09-29 ENCOUNTER — Telehealth (INDEPENDENT_AMBULATORY_CARE_PROVIDER_SITE_OTHER): Payer: BC Managed Care – PPO | Admitting: Psychiatry

## 2019-09-29 DIAGNOSIS — F411 Generalized anxiety disorder: Secondary | ICD-10-CM | POA: Diagnosis not present

## 2019-09-29 DIAGNOSIS — F331 Major depressive disorder, recurrent, moderate: Secondary | ICD-10-CM

## 2019-09-29 NOTE — Progress Notes (Addendum)
[place] Follow up   Patient Identification: Jill Butler MRN:  [id number] Date of Evaluation:  09/29/2019 Referral Source: [facility] Chief Complaint:    depression  follow up  Visit Diagnosis:    ICD-10-CM   1. Major depressive disorder, recurrent episode, moderate (HCC)  F33.1   2. GAD (generalized anxiety disorder)  F41.1     I connected with Jill Butler on 09/29/19 at  1:30 PM EDT by a video enabled telemedicine application and verified that I am speaking with the correct person using two identifiers.   I discussed the limitations of evaluation and management by telemedicine and the availability of in person appointments. The patient expressed understanding and agreed to proceed.  Patient location: home Provider location: home   History of Present Illness: Jill Butler is a 29 years old currently married Caucasian female her initial appointment was after  hospital discharge from [place] for depression in 2020, which was getting worse on celexa    Last visit lexapro 5mg  started she feels physically slow , works 2 jobs and night, says it may be factor of making her tired and she is planning to quit night time and work one job or change  In past has stopped her meds paxil herself saying she was doing fine.      Aggravating factors.husband sick husband has schizophrenia 2 kids.  Modifying factors her kids. parents,   No prior history of abuse or using drugs  Severity  Anxiety coming back  Past Psychiatric History: depression  Previous Psychotropic Medications: Yes   Substance Abuse History in the last 12 months:  No.  Consequences of Substance Abuse: NA  Past Medical History:  Past Medical History:  Diagnosis Date  . Anemia   . Congenital clinodactyly     Past Surgical History:  Procedure Laterality Date  . CESAREAN SECTION     x 2  . COLONOSCOPY    . ESOPHAGOGASTRODUODENOSCOPY    . HAND SURGERY Left    3rd digit  . TOE SURGERY Right     Right great toe  . TUBAL LIGATION      Family Psychiatric History: brother : bipolar  Family History:  Family History  Problem Relation Age of Onset  . Thyroid disease Mother   . Scoliosis Mother   . Diabetes Father   . Hyperlipidemia Father   . Hypertension Father     Social History:   Social History   Socioeconomic History  . Marital status: Married    Spouse name: Not on file  . Number of children: Not on file  . Years of education: Not on file  . Highest education level: Not on file  Occupational History  . Not on file  Tobacco Use  . Smoking status: Never Smoker  . Smokeless tobacco: Never Used  Vaping Use  . Vaping Use: Never used  Substance and Sexual Activity  . Alcohol use: Never  . Drug use: Never  . Sexual activity: Yes    Partners: Male    Birth control/protection: Surgical    Comment: tubal lig  Other Topics Concern  . Not on file  Social History Narrative  . Not on file   Social Determinants of Health   Financial Resource Strain:   . Difficulty of Paying Living Expenses:   Food Insecurity:   . Worried About Charity fundraiser in the Last Year:   . Arboriculturist in the Last Year:   Transportation Needs:   . Lack  of Transportation (Medical):   Marland Kitchen Lack of Transportation (Non-Medical):   Physical Activity:   . Days of Exercise per Week:   . Minutes of Exercise per Session:   Stress:   . Feeling of Stress :   Social Connections:   . Frequency of Communication with Friends and Family:   . Frequency of Social Gatherings with Friends and Family:   . Attends Religious Services:   . Active Member of Clubs or Organizations:   . Attends Archivist Meetings:   Marland Kitchen Marital Status:       Allergies:   Allergies  Allergen Reactions  . Meloxicam Hives and Nausea Only  . Norethindron-Ethinyl Estrad-Fe Hives and Rash  . Other Swelling    rasberry  . Sulfa Antibiotics Hives and Swelling  . Sulfamethoxazole Rash  . Tramadol Nausea Only     Metabolic Disorder Labs: No results found for: HGBA1C, MPG No results found for: PROLACTIN No results found for: CHOL, TRIG, HDL, CHOLHDL, VLDL, LDLCALC No results found for: TSH  Therapeutic Level Labs: No results found for: LITHIUM No results found for: CBMZ No results found for: VALPROATE  Current Medications: Current Outpatient Medications  Medication Sig Dispense Refill  . escitalopram (LEXAPRO) 5 MG tablet TAKE 1 TABLET BY MOUTH EVERY DAY 30 tablet 0  . fluconazole (DIFLUCAN) 150 MG tablet Take one tab by mouth as a single dose.  May repeat in 72 hours if needed. 1 tablet 1  . metroNIDAZOLE (FLAGYL) 500 MG tablet Take one tab by mouth every 12 hours for 7 days. 14 tablet 0   No current facility-administered medications for this visit.    No lean or restlessness  Psychiatric Specialty Exam: Review of Systems  Cardiovascular: Negative for chest pain and palpitations.  Skin: Negative for rash.  Psychiatric/Behavioral: Negative for substance abuse and suicidal ideas.    There were no vitals taken for this visit.There is no height or weight on file to calculate BMI.  General Appearance: Casual  Eye Contact:  Fair  Speech:  Normal Rate  Volume:  Normal  Mood: somewhat subdued  Affect:  Congruent  Thought Process:  Goal Directed  Orientation:  Full (Time, Place, and Person)  Thought Content:  Logical  Suicidal Thoughts:  No  Homicidal Thoughts:  No  Memory:  Immediate;   Fair Recent;   Fair  Judgement:  Fair  Insight:  Fair  Psychomotor Activity:  Normal  Concentration:  Concentration: Fair and Attention Span: Fair  Recall:  [employer] of Knowledge:Fair  Language: Fair  Akathisia:  No  Handed:  Right  AIMS (if indicated):  not done  Assets:  Desire for Improvement  ADL's: normal  Cognition: WNL  Sleep:  Fair   Screenings:   Assessment and Plan: as follows MDD recurrent moderate:; somewhat subdued, stop lexapro due to above . Wants to try not be on  any med while she changes job stress as that being main contributor to stress and tiredness [id number] but wants to handle without med while she transitions off job and reduces her stress Discussed risk of not being on any med Provided supportive therapy I discussed the assessment and treatment plan with the patient. The patient was provided an opportunity to ask questions and all were answered. The patient agreed with the plan and demonstrated an understanding of the instructions.   The patient was advised to call back or seek an in-person evaluation if the symptoms worsen or if the condition fails to improve  as anticipated.  I provided 15 minutes of non-face-to-face time during this encounter.  Fu 52m. . Call for early visit or concerns Merian Capron, MD 6/10/20211:37 PM

## 2019-10-12 ENCOUNTER — Other Ambulatory Visit (HOSPITAL_COMMUNITY): Payer: Self-pay

## 2019-10-12 MED ORDER — ESCITALOPRAM OXALATE 5 MG PO TABS: 5.0000 mg | ORAL_TABLET | Freq: Every day | ORAL | 0 refills | Status: DC

## 2019-10-27 ENCOUNTER — Telehealth (INDEPENDENT_AMBULATORY_CARE_PROVIDER_SITE_OTHER): Payer: BC Managed Care – PPO | Admitting: Psychiatry

## 2019-10-27 ENCOUNTER — Encounter (HOSPITAL_COMMUNITY): Payer: Self-pay | Admitting: Psychiatry

## 2019-10-27 DIAGNOSIS — F411 Generalized anxiety disorder: Secondary | ICD-10-CM

## 2019-10-27 DIAGNOSIS — F331 Major depressive disorder, recurrent, moderate: Secondary | ICD-10-CM

## 2019-10-27 NOTE — Progress Notes (Signed)
[place] Follow up   Patient Identification: Jill Butler MRN:  [id number] Date of Evaluation:  10/27/2019 Referral Source: [facility] Chief Complaint:    depression  follow up  Visit Diagnosis:    ICD-10-CM   1. Major depressive disorder, recurrent episode, moderate (HCC)  F33.1   2. GAD (generalized anxiety disorder)  F41.1      I connected with Shivonne Hrivnak on 10/27/19 at  8:30 AM EDT by a video enabled telemedicine application and verified that I am speaking with the correct person using two identifiers.   I discussed the limitations of evaluation and management by telemedicine and the availability of in person appointments. The patient expressed understanding and agreed to proceed. Patient location: home Provider location: home  History of Present Illness: Ms. Jill Butler is a 29 years old currently married Caucasian female her initial appointment was after  hospital discharge from [facility] for depression in 2020, which was getting worse on celexa    She has stopped meds proir to last visit wanted to hold off because job stress was there. Left one job but got injured hand from other, so in home for 32month.  Says wants to handle without meds. Workers comp not covering injury as she had wrist concern prior job as well   Aggravating factors.husband sick husband has schizophrenia 2 kids.  Modifying factors her kids. parents  No prior history of abuse or using drugs  Severity  Anxiety coming back  Past Psychiatric History: depression  Previous Psychotropic Medications: Yes   Substance Abuse History in the last 12 months:  No.  Consequences of Substance Abuse: NA  Past Medical History:  Past Medical History:  Diagnosis Date  . Anemia   . Congenital clinodactyly     Past Surgical History:  Procedure Laterality Date  . CESAREAN SECTION     x 2  . COLONOSCOPY    . ESOPHAGOGASTRODUODENOSCOPY    . HAND SURGERY Left    3rd digit  . TOE SURGERY Right    Right  great toe  . TUBAL LIGATION      Family Psychiatric History: brother : bipolar  Family History:  Family History  Problem Relation Age of Onset  . Thyroid disease Mother   . Scoliosis Mother   . Diabetes Father   . Hyperlipidemia Father   . Hypertension Father     Social History:   Social History   Socioeconomic History  . Marital status: Married    Spouse name: Not on file  . Number of children: Not on file  . Years of education: Not on file  . Highest education level: Not on file  Occupational History  . Not on file  Tobacco Use  . Smoking status: Never Smoker  . Smokeless tobacco: Never Used  Vaping Use  . Vaping Use: Never used  Substance and Sexual Activity  . Alcohol use: Never  . Drug use: Never  . Sexual activity: Yes    Partners: Male    Birth control/protection: Surgical    Comment: tubal lig  Other Topics Concern  . Not on file  Social History Narrative  . Not on file   Social Determinants of Health   Financial Resource Strain:   . Difficulty of Paying Living Expenses:   Food Insecurity:   . Worried About Charity fundraiser in the Last Year:   . Arboriculturist in the Last Year:   Transportation Needs:   . Film/video editor (Medical):   Marland Kitchen  Lack of Transportation (Non-Medical):   Physical Activity:   . Days of Exercise per Week:   . Minutes of Exercise per Session:   Stress:   . Feeling of Stress :   Social Connections:   . Frequency of Communication with Friends and Family:   . Frequency of Social Gatherings with Friends and Family:   . Attends Religious Services:   . Active Member of Clubs or Organizations:   . Attends Archivist Meetings:   Marland Kitchen Marital Status:       Allergies:   Allergies  Allergen Reactions  . Meloxicam Hives and Nausea Only  . Norethindron-Ethinyl Estrad-Fe Hives and Rash  . Other Swelling    rasberry  . Sulfa Antibiotics Hives and Swelling  . Sulfamethoxazole Rash  . Tramadol Nausea Only     Metabolic Disorder Labs: No results found for: HGBA1C, MPG No results found for: PROLACTIN No results found for: CHOL, TRIG, HDL, CHOLHDL, VLDL, LDLCALC No results found for: TSH  Therapeutic Level Labs: No results found for: LITHIUM No results found for: CBMZ No results found for: VALPROATE  Current Medications: Current Outpatient Medications  Medication Sig Dispense Refill  . escitalopram (LEXAPRO) 5 MG tablet Take 1 tablet (5 mg total) by mouth daily. 90 tablet 0  . fluconazole (DIFLUCAN) 150 MG tablet Take one tab by mouth as a single dose.  May repeat in 72 hours if needed. 1 tablet 1  . metroNIDAZOLE (FLAGYL) 500 MG tablet Take one tab by mouth every 12 hours for 7 days. 14 tablet 0   No current facility-administered medications for this visit.    No lean or restlessness  Psychiatric Specialty Exam: Review of Systems  Cardiovascular: Negative for chest pain and palpitations.  Skin: Negative for rash.  Psychiatric/Behavioral: Negative for substance abuse and suicidal ideas.    There were no vitals taken for this visit.There is no height or weight on file to calculate BMI.  General Appearance: Casual  Eye Contact:  Fair  Speech:  Normal Rate  Volume:  Normal  Mood:somewhat subdued  Affect:  Congruent  Thought Process:  Goal Directed  Orientation:  Full (Time, Place, and Person)  Thought Content:  Logical  Suicidal Thoughts:  No  Homicidal Thoughts:  No  Memory:  Immediate;   Fair Recent;   Fair  Judgement:  Fair  Insight:  Fair  Psychomotor Activity:  Normal  Concentration:  Concentration: Fair and Attention Span: Fair  Recall:  [employer] of Knowledge:Fair  Language: Fair  Akathisia:  No  Handed:  Right  AIMS (if indicated):  not done  Assets:  Desire for Improvement  ADL's: normal  Cognition: WNL  Sleep:  Fair   Screenings:   Assessment and Plan: as follows MDD recurrent moderate:; somewhat subdued, but leaving one job did help stress, she is  trying to recover from her injury so can go back to work. Does not want to be on meds,  [id number] but manageable without meds Discussed risk of not being on any med Provided supportive therapy I discussed the assessment and treatment plan with the patient. The patient was provided an opportunity to ask questions and all were answered. The patient agreed with the plan and demonstrated an understanding of the instructions.   The patient was advised to call back or seek an in-person evaluation if the symptoms worsen or if the condition fails to improve as anticipated.  I provided 15 minutes of non-face-to-face time during this encounter.  Fu  1-68m. . Call for early visit or concerns Merian Capron, MD 7/8/20218:39 AM

## 2019-12-27 ENCOUNTER — Encounter (HOSPITAL_COMMUNITY): Payer: Self-pay | Admitting: Psychiatry

## 2019-12-27 ENCOUNTER — Telehealth (INDEPENDENT_AMBULATORY_CARE_PROVIDER_SITE_OTHER): Payer: BC Managed Care – PPO | Admitting: Psychiatry

## 2019-12-27 DIAGNOSIS — F331 Major depressive disorder, recurrent, moderate: Secondary | ICD-10-CM

## 2019-12-27 DIAGNOSIS — F411 Generalized anxiety disorder: Secondary | ICD-10-CM

## 2019-12-27 NOTE — Progress Notes (Signed)
[place] Follow up   Patient Identification: Jill Butler MRN:  [id number] Date of Evaluation:  12/27/2019 Referral Source: [facility] Chief Complaint:    depression  follow up  Visit Diagnosis:    ICD-10-CM   1. Major depressive disorder, recurrent episode, moderate (HCC)  F33.1   2. GAD (generalized anxiety disorder)  F41.1       I connected with Jill Butler on 12/27/19 at 10:00 AM EDT by a video enabled telemedicine application and verified that I am speaking with the correct person using two identifiers.   I discussed the limitations of evaluation and management by telemedicine and the availability of in person appointments. The patient expressed understanding and agreed to proceed. Patient location: home Provider location: home office  History of Present Illness: Jill Butler is a 29 years old currently married Caucasian female her initial appointment was after  hospital discharge from [facility] hospital for depression in 2020, which was getting worse on celexa    Has been off meds doing fair, wrist injury has limited her work but she was able to find a job Has responsibilities at home but feels its regular stress and not subdued wants to remain off medications Husband and kid is in therapy  Aggravating factors.husband sick husband has schizophrenia 2 kids.  Modifying factors her kids. parents  No prior history of abuse or using drugs  Severity  Anxiety coming back  Past Psychiatric History: depression  Previous Psychotropic Medications: Yes   Substance Abuse History in the last 12 months:  No.  Consequences of Substance Abuse: NA  Past Medical History:  Past Medical History:  Diagnosis Date  . Anemia   . Congenital clinodactyly     Past Surgical History:  Procedure Laterality Date  . CESAREAN SECTION     x 2  . COLONOSCOPY    . ESOPHAGOGASTRODUODENOSCOPY    . HAND SURGERY Left    3rd digit  . TOE SURGERY Right    Right great toe  . TUBAL LIGATION       Family Psychiatric History: brother : bipolar  Family History:  Family History  Problem Relation Age of Onset  . Thyroid disease Mother   . Scoliosis Mother   . Diabetes Father   . Hyperlipidemia Father   . Hypertension Father     Social History:   Social History   Socioeconomic History  . Marital status: Married    Spouse name: Not on file  . Number of children: Not on file  . Years of education: Not on file  . Highest education level: Not on file  Occupational History  . Not on file  Tobacco Use  . Smoking status: Never Smoker  . Smokeless tobacco: Never Used  Vaping Use  . Vaping Use: Never used  Substance and Sexual Activity  . Alcohol use: Never  . Drug use: Never  . Sexual activity: Yes    Partners: Male    Birth control/protection: Surgical    Comment: tubal lig  Other Topics Concern  . Not on file  Social History Narrative  . Not on file   Social Determinants of Health   Financial Resource Strain:   . Difficulty of Paying Living Expenses: Not on file  Food Insecurity:   . Worried About Charity fundraiser in the Last Year: Not on file  . Ran Out of Food in the Last Year: Not on file  Transportation Needs:   . Lack of Transportation (Medical): Not on file  .  Lack of Transportation (Non-Medical): Not on file  Physical Activity:   . Days of Exercise per Week: Not on file  . Minutes of Exercise per Session: Not on file  Stress:   . Feeling of Stress : Not on file  Social Connections:   . Frequency of Communication with Friends and Family: Not on file  . Frequency of Social Gatherings with Friends and Family: Not on file  . Attends Religious Services: Not on file  . Active Member of Clubs or Organizations: Not on file  . Attends Archivist Meetings: Not on file  . Marital Status: Not on file      Allergies:   Allergies  Allergen Reactions  . Meloxicam Hives and Nausea Only  . Norethindron-Ethinyl Estrad-Fe Hives and Rash  .  Other Swelling    rasberry  . Sulfa Antibiotics Hives and Swelling  . Sulfamethoxazole Rash  . Tramadol Nausea Only    Metabolic Disorder Labs: No results found for: HGBA1C, MPG No results found for: PROLACTIN No results found for: CHOL, TRIG, HDL, CHOLHDL, VLDL, LDLCALC No results found for: TSH  Therapeutic Level Labs: No results found for: LITHIUM No results found for: CBMZ No results found for: VALPROATE  Current Medications: Current Outpatient Medications  Medication Sig Dispense Refill  . escitalopram (LEXAPRO) 5 MG tablet Take 1 tablet (5 mg total) by mouth daily. 90 tablet 0  . fluconazole (DIFLUCAN) 150 MG tablet Take one tab by mouth as a single dose.  May repeat in 72 hours if needed. 1 tablet 1  . metroNIDAZOLE (FLAGYL) 500 MG tablet Take one tab by mouth every 12 hours for 7 days. 14 tablet 0   No current facility-administered medications for this visit.    No lean or restlessness  Psychiatric Specialty Exam: Review of Systems  Cardiovascular: Negative for chest pain and palpitations.  Skin: Negative for rash.  Psychiatric/Behavioral: Negative for substance abuse and suicidal ideas.    There were no vitals taken for this visit.There is no height or weight on file to calculate BMI.  General Appearance: Casual  Eye Contact:  Fair  Speech:  Normal Rate  Volume:  Normal  Mood:fair  Affect:  Congruent  Thought Process:  Goal Directed  Orientation:  Full (Time, Place, and Person)  Thought Content:  Logical  Suicidal Thoughts:  No  Homicidal Thoughts:  No  Memory:  Immediate;   Fair Recent;   Fair  Judgement:  Fair  Insight:  Fair  Psychomotor Activity:  Normal  Concentration:  Concentration: Fair and Attention Span: Fair  Recall:  [employer] of Knowledge:Fair  Language: Fair  Akathisia:  No  Handed:  Right  AIMS (if indicated):  not done  Assets:  Desire for Improvement  ADL's: normal  Cognition: WNL  Sleep:  Fair   Screenings:   Assessment  and Plan: as follows MDD recurrent moderate:; doing fair, working 4 days, has wrist surgery in near future Does not want to be on meds CXK:[id number] but manageable without meds Discussed risk of not being on any med Provided supportive therapy I discussed the assessment and treatment plan with the patient. The patient was provided an opportunity to ask questions and all were answered. The patient agreed with the plan and demonstrated an understanding of the instructions.   The patient was advised to call back or seek an in-person evaluation if the symptoms worsen or if the condition fails to improve as anticipated.  I provided 15 minutes of non-face-to-face  time during this encounter.  Fu 28m. . Call for early visit or concerns Merian Capron, MD 9/7/202110:08 AM

## 2020-02-07 ENCOUNTER — Telehealth (INDEPENDENT_AMBULATORY_CARE_PROVIDER_SITE_OTHER): Payer: BC Managed Care – PPO | Admitting: Psychiatry

## 2020-02-07 ENCOUNTER — Encounter (HOSPITAL_COMMUNITY): Payer: Self-pay | Admitting: Psychiatry

## 2020-02-07 DIAGNOSIS — F410 Panic disorder [episodic paroxysmal anxiety] without agoraphobia: Secondary | ICD-10-CM | POA: Diagnosis not present

## 2020-02-07 DIAGNOSIS — F331 Major depressive disorder, recurrent, moderate: Secondary | ICD-10-CM

## 2020-02-07 DIAGNOSIS — F411 Generalized anxiety disorder: Secondary | ICD-10-CM | POA: Diagnosis not present

## 2020-02-07 MED ORDER — PAROXETINE HCL 10 MG PO TABS: 10.0000 mg | ORAL_TABLET | Freq: Every day | ORAL | 0 refills | Status: DC

## 2020-02-07 NOTE — Progress Notes (Signed)
[place] Follow up   Patient Identification: Ladrea Holladay MRN:  [id number] Date of Evaluation:  02/07/2020 Referral Source: [facility] Chief Complaint:    depression/ anxiety   follow up  Visit Diagnosis:    ICD-10-CM   1. Major depressive disorder, recurrent episode, moderate (HCC)  F33.1   2. GAD (generalized anxiety disorder)  F41.1        I connected with Jesselyn Beeghly on 02/07/20 at  4:30 PM EDT by a video enabled telemedicine application and verified that I am speaking with the correct person using two identifiers.  I discussed the limitations of evaluation and management by telemedicine and the availability of in person appointments. The patient expressed understanding and agreed to proceed. Patient location: home Provider location: home office  History of Present Illness: Ms. Schutt is a 29 years old currently married Caucasian female her initial appointment was after  hospital discharge from [facility] hospital for depression in 2020, which was getting worse on celexa   Has been off meds was doing fair last visit . Now working at [employer] and have increased anxiety, feels she has to do work more then she was assigned physically and mentally feels distraught . Have talked to supervisor and is requiring a note regarding anxiety to provide accommodations  Has had panic attacks  While working and had to leave multiple times or call for help, worrying of having panic attacks, talked to supervisor and need accommodations.  Wrist injury also has effected her physically to not go beyond limits  Aggravating factors: husband sic, husband has schizophrenia 2 kids.  Modifying factors her kids. parents No prior history of abuse or using drugs  Severity  Anxiety coming back  Past Psychiatric History: depression  Previous Psychotropic Medications: Yes   Substance Abuse History in the last 12 months:  No.  Consequences of Substance Abuse: NA  Past Medical History:  Past Medical  History:  Diagnosis Date  . Anemia   . Congenital clinodactyly     Past Surgical History:  Procedure Laterality Date  . CESAREAN SECTION     x 2  . COLONOSCOPY    . ESOPHAGOGASTRODUODENOSCOPY    . HAND SURGERY Left    3rd digit  . TOE SURGERY Right    Right great toe  . TUBAL LIGATION      Family Psychiatric History: brother : bipolar  Family History:  Family History  Problem Relation Age of Onset  . Thyroid disease Mother   . Scoliosis Mother   . Diabetes Father   . Hyperlipidemia Father   . Hypertension Father     Social History:   Social History   Socioeconomic History  . Marital status: Married    Spouse name: Not on file  . Number of children: Not on file  . Years of education: Not on file  . Highest education level: Not on file  Occupational History  . Not on file  Tobacco Use  . Smoking status: Never Smoker  . Smokeless tobacco: Never Used  Vaping Use  . Vaping Use: Never used  Substance and Sexual Activity  . Alcohol use: Never  . Drug use: Never  . Sexual activity: Yes    Partners: Male    Birth control/protection: Surgical    Comment: tubal lig  Other Topics Concern  . Not on file  Social History Narrative  . Not on file   Social Determinants of Health   Financial Resource Strain:   . Difficulty of Paying Living Expenses:  Not on file  Food Insecurity:   . Worried About Charity fundraiser in the Last Year: Not on file  . Ran Out of Food in the Last Year: Not on file  Transportation Needs:   . Lack of Transportation (Medical): Not on file  . Lack of Transportation (Non-Medical): Not on file  Physical Activity:   . Days of Exercise per Week: Not on file  . Minutes of Exercise per Session: Not on file  Stress:   . Feeling of Stress : Not on file  Social Connections:   . Frequency of Communication with Friends and Family: Not on file  . Frequency of Social Gatherings with Friends and Family: Not on file  . Attends Religious Services:  Not on file  . Active Member of Clubs or Organizations: Not on file  . Attends Archivist Meetings: Not on file  . Marital Status: Not on file      Allergies:   Allergies  Allergen Reactions  . Meloxicam Hives and Nausea Only  . Norethindron-Ethinyl Estrad-Fe Hives and Rash  . Other Swelling    rasberry  . Sulfa Antibiotics Hives and Swelling  . Sulfamethoxazole Rash  . Tramadol Nausea Only    Metabolic Disorder Labs: No results found for: HGBA1C, MPG No results found for: PROLACTIN No results found for: CHOL, TRIG, HDL, CHOLHDL, VLDL, LDLCALC No results found for: TSH  Therapeutic Level Labs: No results found for: LITHIUM No results found for: CBMZ No results found for: VALPROATE  Current Medications: Current Outpatient Medications  Medication Sig Dispense Refill  . escitalopram (LEXAPRO) 5 MG tablet Take 1 tablet (5 mg total) by mouth daily. 90 tablet 0  . fluconazole (DIFLUCAN) 150 MG tablet Take one tab by mouth as a single dose.  May repeat in 72 hours if needed. 1 tablet 1  . metroNIDAZOLE (FLAGYL) 500 MG tablet Take one tab by mouth every 12 hours for 7 days. 14 tablet 0  . PARoxetine (PAXIL) 10 MG tablet Take 1 tablet (10 mg total) by mouth daily. 30 tablet 0   No current facility-administered medications for this visit.    No lean or restlessness  Psychiatric Specialty Exam: Review of Systems  Cardiovascular: Negative for chest pain and palpitations.  Skin: Negative for rash.  Psychiatric/Behavioral: Negative for substance abuse and suicidal ideas. The patient is nervous/anxious.     There were no vitals taken for this visit.There is no height or weight on file to calculate BMI.  General Appearance: Casual  Eye Contact:  Fair  Speech:  Normal Rate  Volume:  Normal  Mood:anxious  Affect:  Congruent  Thought Process:  Goal Directed  Orientation:  Full (Time, Place, and Person)  Thought Content:  Logical  Suicidal Thoughts:  No  Homicidal  Thoughts:  No  Memory:  Immediate;   Fair Recent;   Fair  Judgement:  Fair  Insight:  Fair  Psychomotor Activity:  Normal  Concentration:  Concentration: Fair and Attention Span: Fair  Recall:  [employer] of Knowledge:Fair  Language: Fair  Akathisia:  No  Handed:  Right  AIMS (if indicated):  not done  Assets:  Desire for Improvement  ADL's: normal  Cognition: WNL  Sleep:  Fair   Screenings:   Assessment and Plan: as follows MDD recurrent moderate:; somehwat subdued due to job stress. Restart paxil 10mg  for now   GAD: anxious, had panic attacks, worried about job and stressed . Will start paxil and write letter  for accommodations   Provided supportive therapy I discussed the assessment and treatment plan with the patient. The patient was provided an opportunity to ask questions and all were answered. The patient agreed with the plan and demonstrated an understanding of the instructions.   The patient was advised to call back or seek an in-person evaluation if the symptoms worsen or if the condition fails to improve as anticipated.  I provided 20 minutes of non-face-to-face time during this encounter.  Fu 70m Merian Capron, MD 10/19/20214:45 PM

## 2020-03-02 ENCOUNTER — Other Ambulatory Visit (HOSPITAL_COMMUNITY): Payer: Self-pay | Admitting: Psychiatry

## 2020-03-12 ENCOUNTER — Telehealth (INDEPENDENT_AMBULATORY_CARE_PROVIDER_SITE_OTHER): Payer: BC Managed Care – PPO | Admitting: Psychiatry

## 2020-03-12 ENCOUNTER — Encounter (HOSPITAL_COMMUNITY): Payer: Self-pay | Admitting: Psychiatry

## 2020-03-12 DIAGNOSIS — F331 Major depressive disorder, recurrent, moderate: Secondary | ICD-10-CM | POA: Diagnosis not present

## 2020-03-12 DIAGNOSIS — F411 Generalized anxiety disorder: Secondary | ICD-10-CM | POA: Diagnosis not present

## 2020-03-12 DIAGNOSIS — F410 Panic disorder [episodic paroxysmal anxiety] without agoraphobia: Secondary | ICD-10-CM | POA: Diagnosis not present

## 2020-03-12 MED ORDER — PAROXETINE HCL 10 MG PO TABS: 10.0000 mg | ORAL_TABLET | Freq: Every day | ORAL | 0 refills | Status: DC

## 2020-03-12 NOTE — Progress Notes (Signed)
[place] Follow up   Patient Identification: Jill Butler MRN:  [id number] Date of Evaluation:  03/12/2020 Referral Source: [facility] Chief Complaint:    depression/ anxiety   follow up  Visit Diagnosis:    ICD-10-CM   1. Major depressive disorder, recurrent episode, moderate (HCC)  F33.1   2. GAD (generalized anxiety disorder)  F41.1   3. Panic attacks  F41.0       I connected with Jill Butler on 03/12/20 at  3:00 PM EST by a video enabled telemedicine application and verified that I am speaking with the correct person using two identifiers.   I discussed the limitations of evaluation and management by telemedicine and the availability of in person appointments. The patient expressed understanding and agreed to proceed. Patient location: home Provider location: home office  History of Present Illness: Jill Butler is a 29 years old currently married Caucasian female her initial appointment was after  hospital discharge from [facility] hospital for depression in 2020, which was getting worse on celexa   Restated paxil last visit, did better, less anxious but at the same time not working at [place] and off work, going thru possible orthopaedic evaluation for wrist and neck   Had panic attacks in past while working and had to leave.    Wrist injury also has effected her physically to not go beyond limits  Aggravating factors: husband sic, husband has schizophrenia 2 kids.  Modifying factors her kids. parents No prior history of abuse or using drugs  Severity  Anxiety coming back  Past Psychiatric History: depression  Previous Psychotropic Medications: Yes   Substance Abuse History in the last 12 months:  No.  Consequences of Substance Abuse: NA  Past Medical History:  Past Medical History:  Diagnosis Date  . Anemia   . Congenital clinodactyly     Past Surgical History:  Procedure Laterality Date  . CESAREAN SECTION     x 2  . COLONOSCOPY    .  ESOPHAGOGASTRODUODENOSCOPY    . HAND SURGERY Left    3rd digit  . TOE SURGERY Right    Right great toe  . TUBAL LIGATION      Family Psychiatric History: brother : bipolar  Family History:  Family History  Problem Relation Age of Onset  . Thyroid disease Mother   . Scoliosis Mother   . Diabetes Father   . Hyperlipidemia Father   . Hypertension Father     Social History:   Social History   Socioeconomic History  . Marital status: Married    Spouse name: Not on file  . Number of children: Not on file  . Years of education: Not on file  . Highest education level: Not on file  Occupational History  . Not on file  Tobacco Use  . Smoking status: Never Smoker  . Smokeless tobacco: Never Used  Vaping Use  . Vaping Use: Never used  Substance and Sexual Activity  . Alcohol use: Never  . Drug use: Never  . Sexual activity: Yes    Partners: Male    Birth control/protection: Surgical    Comment: tubal lig  Other Topics Concern  . Not on file  Social History Narrative  . Not on file   Social Determinants of Health   Financial Resource Strain:   . Difficulty of Paying Living Expenses: Not on file  Food Insecurity:   . Worried About Charity fundraiser in the Last Year: Not on file  . Ran Out of  Food in the Last Year: Not on file  Transportation Needs:   . Lack of Transportation (Medical): Not on file  . Lack of Transportation (Non-Medical): Not on file  Physical Activity:   . Days of Exercise per Week: Not on file  . Minutes of Exercise per Session: Not on file  Stress:   . Feeling of Stress : Not on file  Social Connections:   . Frequency of Communication with Friends and Family: Not on file  . Frequency of Social Gatherings with Friends and Family: Not on file  . Attends Religious Services: Not on file  . Active Member of Clubs or Organizations: Not on file  . Attends Archivist Meetings: Not on file  . Marital Status: Not on file      Allergies:    Allergies  Allergen Reactions  . Meloxicam Hives and Nausea Only  . Norethindron-Ethinyl Estrad-Fe Hives and Rash  . Other Swelling    rasberry  . Sulfa Antibiotics Hives and Swelling  . Sulfamethoxazole Rash  . Tramadol Nausea Only    Metabolic Disorder Labs: No results found for: HGBA1C, MPG No results found for: PROLACTIN No results found for: CHOL, TRIG, HDL, CHOLHDL, VLDL, LDLCALC No results found for: TSH  Therapeutic Level Labs: No results found for: LITHIUM No results found for: CBMZ No results found for: VALPROATE  Current Medications: Current Outpatient Medications  Medication Sig Dispense Refill  . fluconazole (DIFLUCAN) 150 MG tablet Take one tab by mouth as a single dose.  May repeat in 72 hours if needed. 1 tablet 1  . metroNIDAZOLE (FLAGYL) 500 MG tablet Take one tab by mouth every 12 hours for 7 days. 14 tablet 0  . PARoxetine (PAXIL) 10 MG tablet Take 1 tablet (10 mg total) by mouth daily. 30 tablet 0   No current facility-administered medications for this visit.    No lean or restlessness  Psychiatric Specialty Exam: Review of Systems  Cardiovascular: Negative for chest pain and palpitations.  Skin: Negative for rash.  Psychiatric/Behavioral: Negative for substance abuse and suicidal ideas. The patient is nervous/anxious.     There were no vitals taken for this visit.There is no height or weight on file to calculate BMI.  General Appearance: Casual  Eye Contact:  Fair  Speech:  Normal Rate  Volume:  Normal  Mood: some better  Affect:  Congruent  Thought Process:  Goal Directed  Orientation:  Full (Time, Place, and Person)  Thought Content:  Logical  Suicidal Thoughts:  No  Homicidal Thoughts:  No  Memory:  Immediate;   Fair Recent;   Fair  Judgement:  Fair  Insight:  Fair  Psychomotor Activity:  Normal  Concentration:  Concentration: Fair and Attention Span: Fair  Recall:  [employer] of Knowledge:Fair  Language: Fair  Akathisia:  No   Handed:  Right  AIMS (if indicated):  not done  Assets:  Desire for Improvement  ADL's: normal  Cognition: WNL  Sleep:  Fair   Screenings:   Assessment and Plan: as follows MDD recurrent moderate:; some better, continue paxil 10mg    GAD: some better but not working for now, continue paxil, consider therapy to work on [employer], therapeutic and anxiety Provided supportive therapy I discussed the assessment and treatment plan with the patient. The patient was provided an opportunity to ask questions and all were answered. The patient agreed with the plan and demonstrated an understanding of the instructions.   The patient was advised to call back  or seek an in-person evaluation if the symptoms worsen or if the condition fails to improve as anticipated.  I provided 15 minutes of non-face-to-face time during this encounter.  Fu 75m.  Merian Capron, MD 11/22/20213:13 PM

## 2020-03-19 ENCOUNTER — Other Ambulatory Visit (HOSPITAL_COMMUNITY): Payer: Self-pay

## 2020-03-19 ENCOUNTER — Encounter (HOSPITAL_COMMUNITY): Payer: Self-pay | Admitting: Psychiatry

## 2020-03-19 ENCOUNTER — Telehealth (HOSPITAL_COMMUNITY): Payer: Self-pay | Admitting: Psychiatry

## 2020-03-19 MED ORDER — PAROXETINE HCL 10 MG PO TABS: 10.0000 mg | ORAL_TABLET | Freq: Every day | ORAL | 0 refills | Status: DC

## 2020-03-19 NOTE — Telephone Encounter (Signed)
Sent. You can scan back will drop someday this week

## 2020-03-19 NOTE — Telephone Encounter (Signed)
Pt states she needs a letter for accommodations for work. Due to her anxiety and panic attacks. (the same letter that was wrote in oct).  We can email to her when complete)

## 2020-03-28 ENCOUNTER — Telehealth (HOSPITAL_COMMUNITY): Payer: BC Managed Care – PPO | Admitting: Psychiatry

## 2020-05-09 ENCOUNTER — Encounter (HOSPITAL_COMMUNITY): Payer: Self-pay | Admitting: Psychiatry

## 2020-05-09 ENCOUNTER — Telehealth (INDEPENDENT_AMBULATORY_CARE_PROVIDER_SITE_OTHER): Payer: BC Managed Care – PPO | Admitting: Psychiatry

## 2020-05-09 DIAGNOSIS — F331 Major depressive disorder, recurrent, moderate: Secondary | ICD-10-CM | POA: Diagnosis not present

## 2020-05-09 DIAGNOSIS — F411 Generalized anxiety disorder: Secondary | ICD-10-CM

## 2020-05-09 NOTE — Progress Notes (Signed)
[facility] Follow up   Patient Identification: Jill Butler MRN:  [id number] Date of Evaluation:  05/09/2020 Referral Source: [facility] Chief Complaint:    depression/ anxiety   follow up  Visit Diagnosis:    ICD-10-CM   1. Major depressive disorder, recurrent episode, moderate (HCC)  F33.1   2. GAD (generalized anxiety disorder)  F41.1       Virtual Visit via Video Note  I connected with Jill Butler on 05/09/20 at  1:00 PM EST by a video enabled telemedicine application and verified that I am speaking with the correct person using two identifiers.  Location: Patient: home  Provider: home office   I discussed the limitations of evaluation and management by telemedicine and the availability of in person appointments. The patient expressed understanding and agreed to proceed.   I discussed the assessment and treatment plan with the patient. The patient was provided an opportunity to ask questions and all were answered. The patient agreed with the plan and demonstrated an understanding of the instructions.   The patient was advised to call back or seek an in-person evaluation if the symptoms worsen or if the condition fails to improve as anticipated.  I provided 11 minutes of non-face-to-face time during this encounter.   Merian Capron, MD   History of Present Illness: Jill Butler is a 30 years old currently married Caucasian female her initial appointment was after  hospital discharge from [place] for depression in 2020, which was getting worse on celexa   Not working at [employer] and off work, going thru possible orthopaedic evaluation for wrist and neck Getting wrist surgery tomorrow paxil helps keep some balance, but stress related to finances and no job Parents also lives together Husband has schizophrenia but didn't get disability   Had panic attacks in past while working and had to leave.   Aggravating factors: husband sic, husband has schizophrenia 2  kids.  Modifying factors her kids. parents No prior history of abuse or using drugs    Past Psychiatric History: depression  Previous Psychotropic Medications: Yes   Substance Abuse History in the last 12 months:  No.  Consequences of Substance Abuse: NA  Past Medical History:  Past Medical History:  Diagnosis Date  . Anemia   . Congenital clinodactyly     Past Surgical History:  Procedure Laterality Date  . CESAREAN SECTION     x 2  . COLONOSCOPY    . ESOPHAGOGASTRODUODENOSCOPY    . HAND SURGERY Left    3rd digit  . TOE SURGERY Right    Right great toe  . TUBAL LIGATION      Family Psychiatric History: brother : bipolar  Family History:  Family History  Problem Relation Age of Onset  . Thyroid disease Mother   . Scoliosis Mother   . Diabetes Father   . Hyperlipidemia Father   . Hypertension Father     Social History:   Social History   Socioeconomic History  . Marital status: Married    Spouse name: Not on file  . Number of children: Not on file  . Years of education: Not on file  . Highest education level: Not on file  Occupational History  . Not on file  Tobacco Use  . Smoking status: Never Smoker  . Smokeless tobacco: Never Used  Vaping Use  . Vaping Use: Never used  Substance and Sexual Activity  . Alcohol use: Never  . Drug use: Never  . Sexual activity: Yes  Partners: Male    Birth control/protection: Surgical    Comment: tubal lig  Other Topics Concern  . Not on file  Social History Narrative  . Not on file   Social Determinants of Health   Financial Resource Strain: Not on file  Food Insecurity: Not on file  Transportation Needs: Not on file  Physical Activity: Not on file  Stress: Not on file  Social Connections: Not on file      Allergies:   Allergies  Allergen Reactions  . Meloxicam Hives and Nausea Only  . Norethindron-Ethinyl Estrad-Fe Hives and Rash  . Other Swelling    rasberry  . Sulfa Antibiotics Hives  and Swelling  . Sulfamethoxazole Rash  . Tramadol Nausea Only    Metabolic Disorder Labs: No results found for: HGBA1C, MPG No results found for: PROLACTIN No results found for: CHOL, TRIG, HDL, CHOLHDL, VLDL, LDLCALC No results found for: TSH  Therapeutic Level Labs: No results found for: LITHIUM No results found for: CBMZ No results found for: VALPROATE  Current Medications: Current Outpatient Medications  Medication Sig Dispense Refill  . fluconazole (DIFLUCAN) 150 MG tablet Take one tab by mouth as a single dose.  May repeat in 72 hours if needed. 1 tablet 1  . metroNIDAZOLE (FLAGYL) 500 MG tablet Take one tab by mouth every 12 hours for 7 days. 14 tablet 0  . PARoxetine (PAXIL) 10 MG tablet Take 1 tablet (10 mg total) by mouth daily. 90 tablet 0   No current facility-administered medications for this visit.    No lean or restlessness  Psychiatric Specialty Exam: Review of Systems  Cardiovascular: Positive for palpitations. Negative for chest pain.  Skin: Negative for rash.  Psychiatric/Behavioral: Negative for substance abuse and suicidal ideas.    There were no vitals taken for this visit.There is no height or weight on file to calculate BMI.  General Appearance: Casual  Eye Contact:  Fair  Speech:  Normal Rate  Volume:  Normal  Mood: fair  Affect:  Congruent  Thought Process:  Goal Directed  Orientation:  Full (Time, Place, and Person)  Thought Content:  Logical  Suicidal Thoughts:  No  Homicidal Thoughts:  No  Memory:  Immediate;   Fair Recent;   Fair  Judgement:  Fair  Insight:  Fair  Psychomotor Activity:  Normal  Concentration:  Concentration: Fair and Attention Span: Fair  Recall:  [employer] of Knowledge:Fair  Language: Fair  Akathisia:  No  Handed:  Right  AIMS (if indicated):  not done  Assets:  Desire for Improvement  ADL's: normal  Cognition: WNL  Sleep:  Fair   Screenings:   Assessment and Plan: as follows MDD recurrent moderate:;  fair or manageable, but finance pressure effects her. Continue paxil, she will call back to schedule therapy  GAD: fluctuates, circumstances are challenging, continue paxil, will call for therapy   Fu 25m.  Merian Capron, MD 1/19/20221:10 PM

## 2020-06-07 IMAGING — US US PELVIS COMPLETE WITH TRANSVAGINAL
1 series · 13 of 25 positions shown · non-contrast
Comparison: None

CLINICAL DATA: RIGHT lower quadrant pain and tenderness for 1 month
worse during menses, missed menses 11 days ago but pain has
increased during the interval, negative pregnancy test, normal white
blood cell count



[Series 1: us pelvis complete with transvaginal · 0.24mm/px · 13 of 108 slices shown]
[im 1/108]
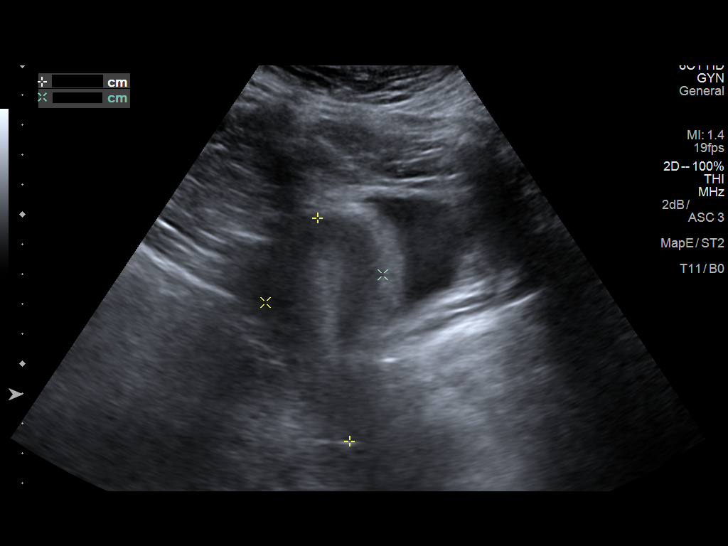
[im 9/108]
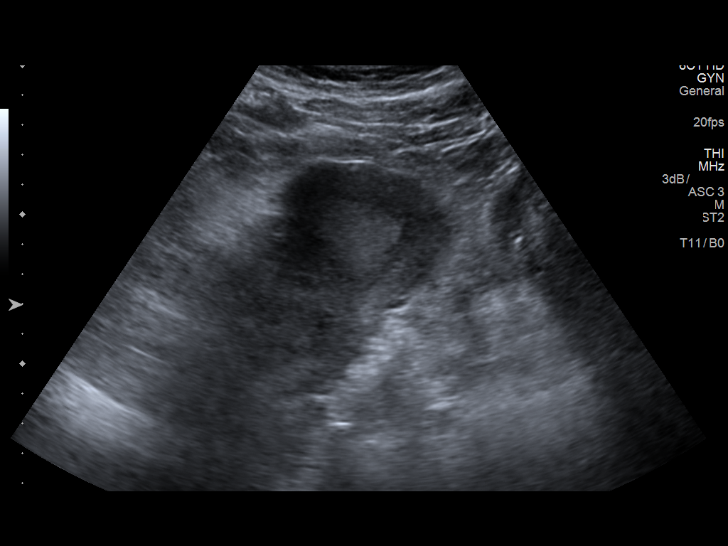
[im 18/108]
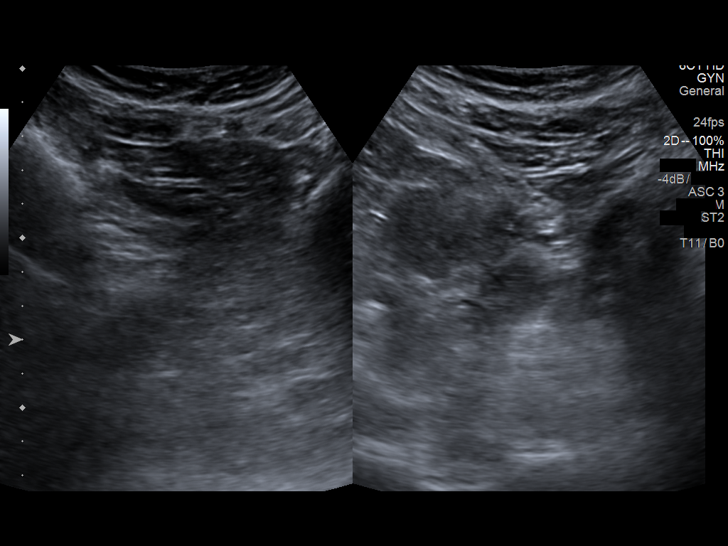
[im 27/108]
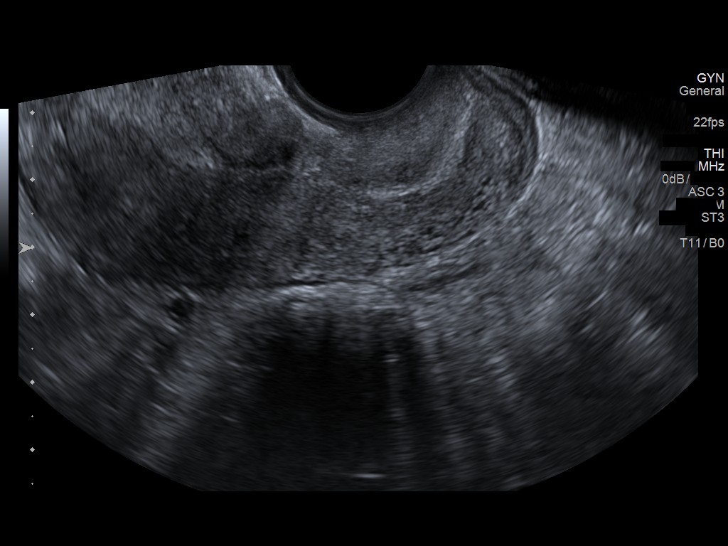
[im 36/108]
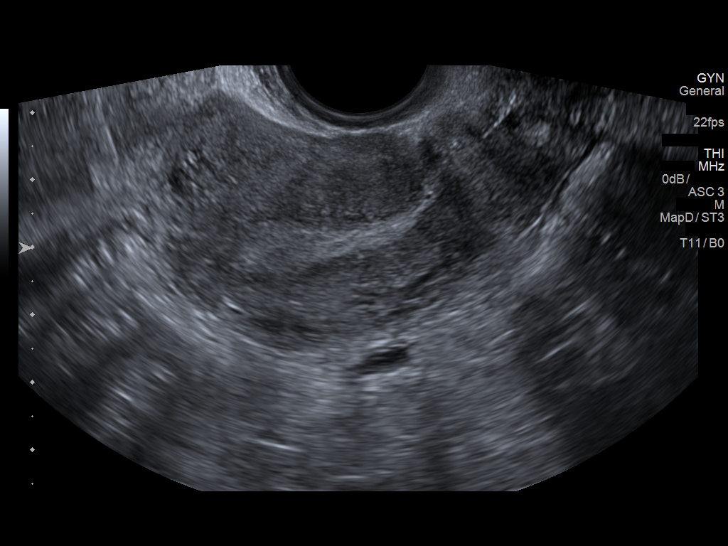
[im 45/108]
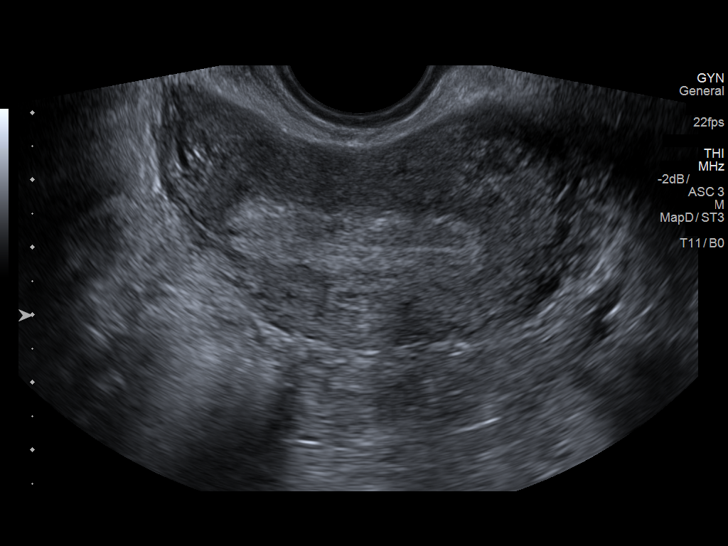
[im 54/108]
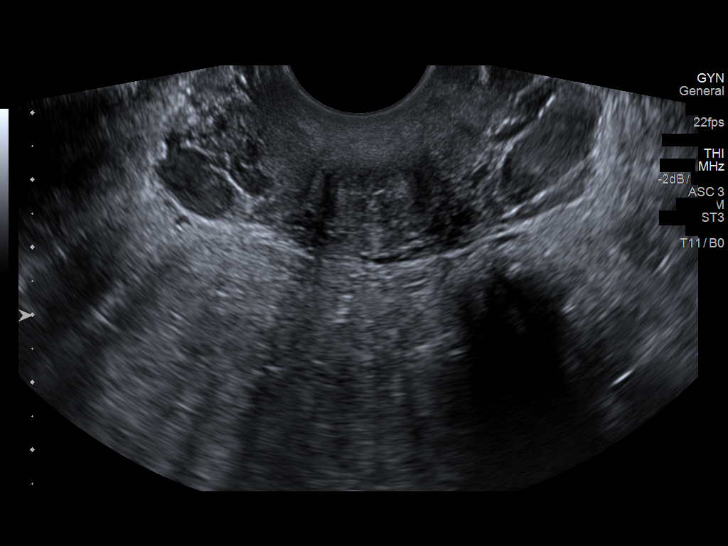
[im 63/108]
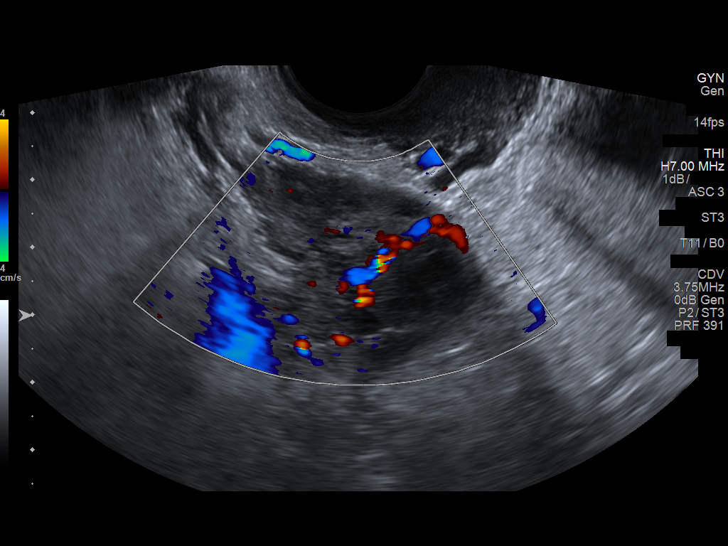
[im 72/108]
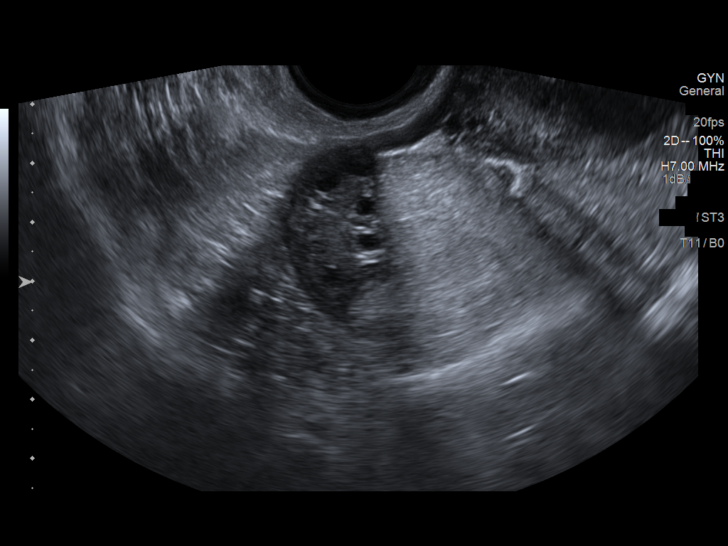
[im 81/108]
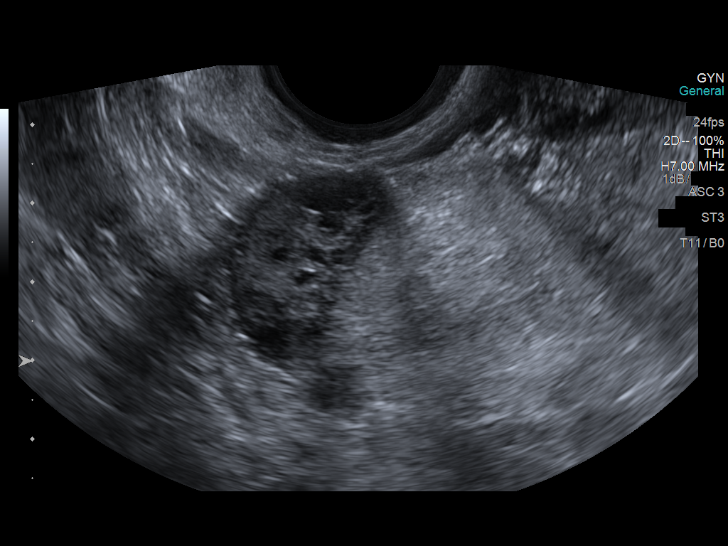
[im 90/108]
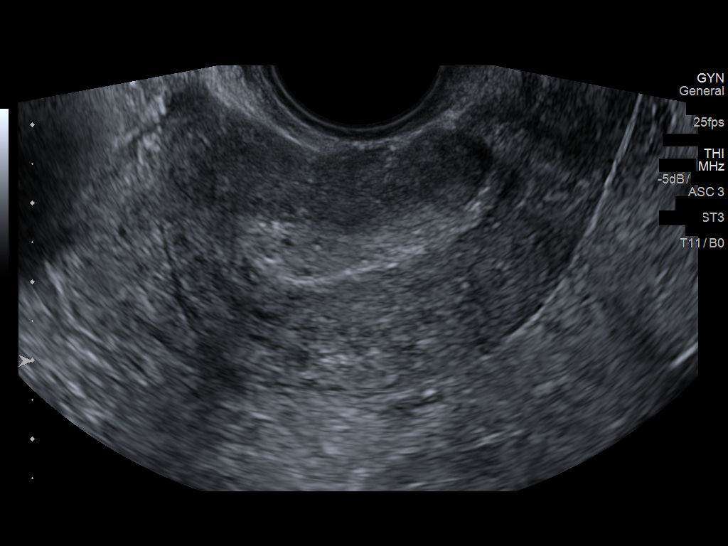
[im 99/108]
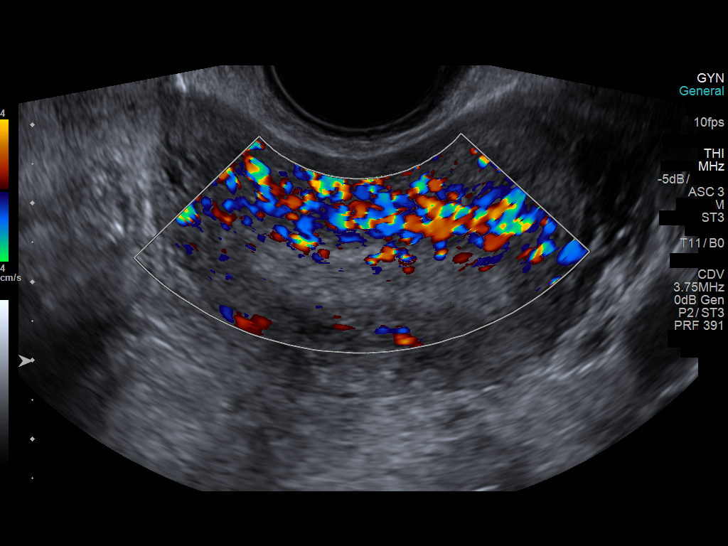
[im 108/108]
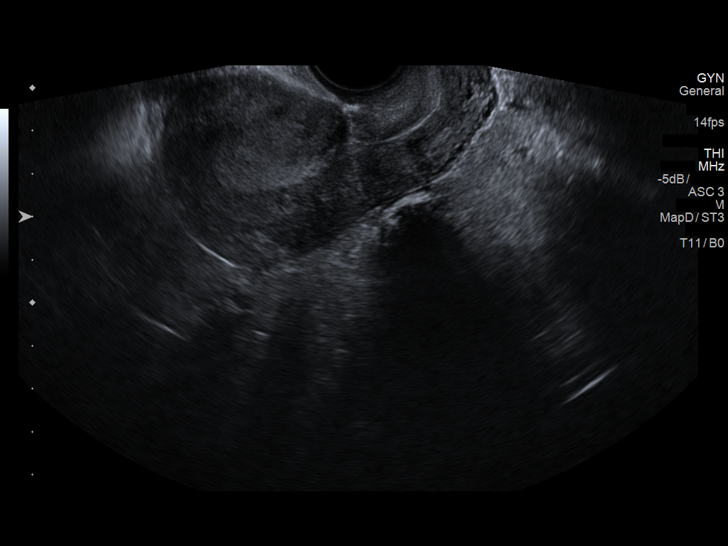

[13 of 25 positions shown; findings below may reference images not displayed]

FINDINGS: Uterus

Measurements: 7.7 x 4.1 x 5.4 cm = volume: 88 mL. Anteverted. Small
posterior wall intramural leiomyoma 12 x 8 x 11 mm. No additional
masses. Anterior wall scar from Caesarean section.

Endometrium

Thickness: 8 mm.  No endometrial fluid or focal abnormality

Right ovary

Measurements: 3.4 x 2.6 x 2.0 cm = volume: 9.0 mL. Normal morphology
without mass. Internal blood flow on color Doppler imaging.

Left ovary

Measurements: 3.7 x 2.7 x 2.5 cm. = volume: 13.0 mL. Probable corpus
luteum. Normal morphology without additional mass. Internal blood
flow present on color Doppler imaging.

Other findings

No free pelvic fluid.  No adnexal masses.
IMPRESSION: Small posterior wall intramural leiomyoma of the mid uterus 12 mm
greatest size.

Otherwise negative exam.

## 2020-07-16 ENCOUNTER — Telehealth (HOSPITAL_COMMUNITY): Payer: BC Managed Care – PPO | Admitting: Psychiatry

## 2020-07-18 ENCOUNTER — Telehealth (INDEPENDENT_AMBULATORY_CARE_PROVIDER_SITE_OTHER): Payer: BC Managed Care – PPO | Admitting: Psychiatry

## 2020-07-18 ENCOUNTER — Encounter (HOSPITAL_COMMUNITY): Payer: Self-pay | Admitting: Psychiatry

## 2020-07-18 DIAGNOSIS — F411 Generalized anxiety disorder: Secondary | ICD-10-CM

## 2020-07-18 DIAGNOSIS — F331 Major depressive disorder, recurrent, moderate: Secondary | ICD-10-CM | POA: Diagnosis not present

## 2020-07-18 MED ORDER — PAROXETINE HCL 10 MG PO TABS: 10.0000 mg | ORAL_TABLET | Freq: Every day | ORAL | 0 refills | Status: DC

## 2020-07-18 NOTE — Progress Notes (Signed)
[facility] Follow up   Patient Identification: Jill Butler MRN:  [id number] Date of Evaluation:  07/18/2020 Referral Source: [facility] Chief Complaint:    depression/ anxiety   follow up  Visit Diagnosis:    ICD-10-CM   1. Major depressive disorder, recurrent episode, moderate (HCC)  F33.1   2. GAD (generalized anxiety disorder)  F41.1      Virtual Visit via Video Note  I connected with [name] on 07/18/20 at  1:00 PM EDT by a video enabled telemedicine application and verified that I am speaking with the correct person using two identifiers.  Location: Patient: home Provider: home office   I discussed the limitations of evaluation and management by telemedicine and the availability of in person ap    I discussed the assessment and treatment plan with the patient. The patient was provided an opportunity to ask questions and all were answered. The patient agreed with the plan and demonstrated an understanding of the instructions.   The patient was advised to call back or seek an in-person evaluation if the symptoms worsen or if the condition fails to improve as anticipated.  I provided 10  minutes or less of non-face-to-face time during this encounter.    History of Present Illness: Jill Butler is a 30 years old currently married Caucasian female her initial appointment was after  hospital discharge from [place] for depression in 2020, which was getting worse on celexa   Not working at [employer] and off work,  Going thru another wrist surgery to take plates out,  Denies pain as of now paxil helps keep some balance, Parents also lives together Husband has schizophrenia    Aggravating factors: husband sic, husband has schizophrenia 2 kids.  Modifying factors : kids,  No prior history of abuse or using drugs    Past Psychiatric History: depression  Previous Psychotropic Medications: Yes   Substance Abuse History in the last 12 months:   No.  Consequences of Substance Abuse: NA  Past Medical History:  Past Medical History:  Diagnosis Date  . Anemia   . Congenital clinodactyly     Past Surgical History:  Procedure Laterality Date  . CESAREAN SECTION     x 2  . COLONOSCOPY    . ESOPHAGOGASTRODUODENOSCOPY    . HAND SURGERY Left    3rd digit  . TOE SURGERY Right    Right great toe  . TUBAL LIGATION      Family Psychiatric History: brother : bipolar  Family History:  Family History  Problem Relation Age of Onset  . Thyroid disease Mother   . Scoliosis Mother   . Diabetes Father   . Hyperlipidemia Father   . Hypertension Father     Social History:   Social History   Socioeconomic History  . Marital status: Married    Spouse name: Not on file  . Number of children: Not on file  . Years of education: Not on file  . Highest education level: Not on file  Occupational History  . Not on file  Tobacco Use  . Smoking status: Never Smoker  . Smokeless tobacco: Never Used  Vaping Use  . Vaping Use: Never used  Substance and Sexual Activity  . Alcohol use: Never  . Drug use: Never  . Sexual activity: Yes    Partners: Male    Birth control/protection: Surgical    Comment: tubal lig  Other Topics Concern  . Not on file  Social History Narrative  . Not  on file   Social Determinants of Health   Financial Resource Strain: Not on file  Food Insecurity: Not on file  Transportation Needs: Not on file  Physical Activity: Not on file  Stress: Not on file  Social Connections: Not on file      Allergies:   Allergies  Allergen Reactions  . Meloxicam Hives and Nausea Only  . Norethindron-Ethinyl Estrad-Fe Hives and Rash  . Other Swelling    rasberry  . Sulfa Antibiotics Hives and Swelling  . Sulfamethoxazole Rash  . Tramadol Nausea Only    Metabolic Disorder Labs: No results found for: HGBA1C, MPG No results found for: PROLACTIN No results found for: CHOL, TRIG, HDL, CHOLHDL, VLDL,  LDLCALC No results found for: TSH  Therapeutic Level Labs: No results found for: LITHIUM No results found for: CBMZ No results found for: VALPROATE  Current Medications: Current Outpatient Medications  Medication Sig Dispense Refill  . fluconazole (DIFLUCAN) 150 MG tablet Take one tab by mouth as a single dose.  May repeat in 72 hours if needed. 1 tablet 1  . metroNIDAZOLE (FLAGYL) 500 MG tablet Take one tab by mouth every 12 hours for 7 days. 14 tablet 0  . PARoxetine (PAXIL) 10 MG tablet Take 1 tablet (10 mg total) by mouth daily. 90 tablet 0   No current facility-administered medications for this visit.    No lean or restlessness  Psychiatric Specialty Exam: Review of Systems  Cardiovascular: Negative for chest pain.  Psychiatric/Behavioral: Negative for substance abuse and suicidal ideas.    There were no vitals taken for this visit.There is no height or weight on file to calculate BMI.  General Appearance: Casual  Eye Contact:  Fair  Speech:  Normal Rate  Volume:  Normal  Mood: fair  Affect:  Congruent  Thought Process:  Goal Directed  Orientation:  Full (Time, Place, and Person)  Thought Content:  Logical  Suicidal Thoughts:  No  Homicidal Thoughts:  No  Memory:  Immediate;   Fair Recent;   Fair  Judgement:  Fair  Insight:  Fair  Psychomotor Activity:  Normal  Concentration:  Concentration: Fair and Attention Span: Fair  Recall:  [employer] of Knowledge:Fair  Language: Fair  Akathisia:  No  Handed:  Right  AIMS (if indicated):  not done  Assets:  Desire for Improvement  ADL's: normal  Cognition: WNL  Sleep:  Fair   Screenings:   Assessment and Plan: as follows MDD recurrent moderate:; manageable, stress related to pending surgery Continue paxil   GAD: fluctuates, circumstances are challenging, continue paxil, can consider therapy  Otherwise she does not want to increase med for now Fu 73m.  Merian Capron, MD 3/30/20221:11 PM

## 2020-10-08 ENCOUNTER — Encounter (HOSPITAL_COMMUNITY): Payer: Self-pay | Admitting: Psychiatry

## 2020-10-08 ENCOUNTER — Telehealth (INDEPENDENT_AMBULATORY_CARE_PROVIDER_SITE_OTHER): Payer: Medicaid Other | Admitting: Psychiatry

## 2020-10-08 DIAGNOSIS — F331 Major depressive disorder, recurrent, moderate: Secondary | ICD-10-CM

## 2020-10-08 DIAGNOSIS — F411 Generalized anxiety disorder: Secondary | ICD-10-CM

## 2020-10-08 NOTE — Progress Notes (Signed)
[place] Follow up   Patient Identification: Jill Butler MRN:  [id number] Date of Evaluation:  10/08/2020 Referral Source: [facility] Chief Complaint:    depression/ anxiety   follow up  Visit Diagnosis:    ICD-10-CM   1. Major depressive disorder, recurrent episode, moderate (HCC)  F33.1     2. GAD (generalized anxiety disorder)  F41.1       Virtual Visit via Video Note  I connected with Jill Butler on 10/08/20 at 10:30 AM EDT by a video enabled telemedicine application and verified that I am speaking with the correct person using two identifiers.  Location: Patient: home  Provider: home office   I discussed the limitations of evaluation and management by telemedicine and the availability of in person appointments. The patient expressed understanding and agreed to proceed.     I discussed the assessment and treatment plan with the patient. The patient was provided an opportunity to ask questions and all were answered. The patient agreed with the plan and demonstrated an understanding of the instructions.   The patient was advised to call back or seek an in-person evaluation if the symptoms worsen or if the condition fails to improve as anticipated.  I provided 10  minutes of non-face-to-face time during this encounter.    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  Modifying factors : Kids, current job No prior history of abuse or using drugs    Past Psychiatric History: depression  Previous Psychotropic Medications: Yes   Substance Abuse History in the last 12 months:  No.  Consequences of Substance Abuse: NA  Past Medical History:  Past Medical History:  Diagnosis Date   Anemia    Congenital clinodactyly     Past Surgical History:  Procedure Laterality Date   CESAREAN SECTION     x 2   COLONOSCOPY     ESOPHAGOGASTRODUODENOSCOPY     HAND SURGERY Left    3rd digit   TOE SURGERY Right    Right great toe   TUBAL LIGATION      Family Psychiatric History: brother : bipolar  Family History:  Family History  Problem Relation Age of Onset   Thyroid disease Mother    Scoliosis Mother    Diabetes Father    Hyperlipidemia Father    Hypertension Father     Social History:   Social History   Socioeconomic History   Marital status: Married    Spouse name: Not on file   Number of children: Not on file   Years of education: Not on file   Highest education level: Not on file  Occupational History   Not on file  Tobacco Use   Smoking status: Never   Smokeless tobacco: Never  Vaping Use   Vaping Use: Never used  Substance and Sexual Activity   Alcohol use: Never  Drug use: Never   Sexual activity: Yes    Partners: Male    Birth control/protection: Surgical    Comment: tubal lig  Other Topics Concern   Not on file  Social History Narrative   Not on file   Social Determinants of Health   Financial Resource Strain: Not on file  Food Insecurity: Not on file  Transportation Needs: Not on file  Physical Activity: Not on file  Stress: Not on file  Social Connections: Not on file      Allergies:   Allergies  Allergen Reactions   Meloxicam Hives and Nausea Only   Norethindron-Ethinyl Estrad-Fe Hives and Rash   Other Swelling    rasberry   Sulfa Antibiotics Hives and Swelling    Sulfamethoxazole Rash   Tramadol Nausea Only    Metabolic Disorder Labs: No results found for: HGBA1C, MPG No results found for: PROLACTIN No results found for: CHOL, TRIG, HDL, CHOLHDL, VLDL, LDLCALC No results found for: TSH  Therapeutic Level Labs: No results found for: LITHIUM No results found for: CBMZ No results found for: VALPROATE  Current Medications: Current Outpatient Medications  Medication Sig Dispense Refill   fluconazole (DIFLUCAN) 150 MG tablet Take one tab by mouth as a single dose.  May repeat in 72 hours if needed. 1 tablet 1   metroNIDAZOLE (FLAGYL) 500 MG tablet Take one tab by mouth every 12 hours for 7 days. 14 tablet 0   PARoxetine (PAXIL) 10 MG tablet Take 1 tablet (10 mg total) by mouth daily. 90 tablet 0   No current facility-administered medications for this visit.    No lean or restlessness  Psychiatric Specialty Exam: Review of Systems  Cardiovascular:  Negative for chest pain.  Psychiatric/Behavioral:  Negative for substance abuse and suicidal ideas.    There were no vitals taken for this visit.There is no height or weight on file to calculate BMI.  General Appearance: Casual  Eye Contact:  Fair  Speech:  Normal Rate  Volume:  Normal  Mood: fair  Affect:  Congruent  Thought Process:  Goal Directed  Orientation:  Full (Time, Place, and Person)  Thought Content:  Logical  Suicidal Thoughts:  No  Homicidal Thoughts:  No  Memory:  Immediate;   Fair Recent;   Fair  Judgement:  Fair  Insight:  Fair  Psychomotor Activity:  Normal  Concentration:  Concentration: Fair and Attention Span: Fair  Recall:  [employer] of Knowledge:Fair  Language: Fair  Akathisia:  No  Handed:  Right  AIMS (if indicated):  not done  Assets:  Desire for Improvement  ADL's: normal  Cognition: WNL  Sleep:  Fair   Screenings: Flowsheet Row Video Visit from 10/08/2020 in [place] Video Visit from 07/18/2020 in [facility] No Risk No Risk       Assessment and Plan: as follows MDD recurrent moderate:; Doing better continue Paxil  GAD: Improved since current job is less stressful continue Paxil and therapy Follow-up in 3 months or earlier if needed  Merian Capron, MD 6/20/202210:38 AM

## 2021-01-07 ENCOUNTER — Telehealth (INDEPENDENT_AMBULATORY_CARE_PROVIDER_SITE_OTHER): Payer: Medicaid Other | Admitting: Psychiatry

## 2021-01-07 ENCOUNTER — Encounter (HOSPITAL_COMMUNITY): Payer: Self-pay | Admitting: Psychiatry

## 2021-01-07 DIAGNOSIS — F411 Generalized anxiety disorder: Secondary | ICD-10-CM

## 2021-01-07 DIAGNOSIS — F331 Major depressive disorder, recurrent, moderate: Secondary | ICD-10-CM

## 2021-01-07 MED ORDER — PAROXETINE HCL 10 MG PO TABS: 10.0000 mg | ORAL_TABLET | Freq: Every day | ORAL | 0 refills | Status: DC

## 2021-01-07 NOTE — Progress Notes (Signed)
[place] Follow up   Patient Identification: Jill Butler MRN:  [id number] Date of Evaluation:  01/07/2021 Referral Source: [facility] Chief Complaint:    depression/ anxiety   follow up  Visit Diagnosis:    ICD-10-CM   1. Major depressive disorder, recurrent episode, moderate (HCC)  F33.1     2. GAD (generalized anxiety disorder)  F41.1      Virtual Visit via Video Note  I connected with Jill Butler on 01/07/21 at  9:00 AM EDT by a video enabled telemedicine application and verified that I am speaking with the correct person using two identifiers.  Location: Patient: home Provider: home office   I discussed the limitations of evaluation and management by telemedicine and the availability of in person appointments. The patient expressed understanding and agreed to proceed.      I discussed the assessment and treatment plan with the patient. The patient was provided an opportunity to ask questions and all were answered. The patient agreed with the plan and demonstrated an understanding of the instructions.   The patient was advised to call back or seek an in-person evaluation if the symptoms worsen or if the condition fails to improve as anticipated.    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  Modifying factors : Kids, current job No prior history of abuse or using drugs    Past Psychiatric History: depression  Previous Psychotropic Medications: Yes   Substance Abuse History in the last 12 months:  No.  Consequences of Substance Abuse: NA  Past Medical History:  Past Medical History:  Diagnosis Date   Anemia    Congenital clinodactyly     Past Surgical History:  Procedure Laterality Date   CESAREAN SECTION     x 2   COLONOSCOPY     ESOPHAGOGASTRODUODENOSCOPY     HAND SURGERY Left    3rd digit   TOE SURGERY Right    Right great toe   TUBAL LIGATION      Family Psychiatric History: brother : bipolar  Family History:  Family History  Problem Relation Age of Onset   Thyroid disease Mother    Scoliosis Mother    Diabetes Father    Hyperlipidemia Father    Hypertension Father     Social History:   Social History   Socioeconomic History   Marital status: Married    Spouse name: Not on file   Number of children: Not on file   Years of education: Not on file   Highest education level: Not on file  Occupational History   Not on file  Tobacco Use   Smoking status: Never   Smokeless tobacco: Never  Vaping Use   Vaping Use: Never used  Substance and Sexual Activity   Alcohol use: Never   Drug use:  Never   Sexual activity: Yes    Partners: Male    Birth control/protection: Surgical    Comment: tubal lig  Other Topics Concern   Not on file  Social History Narrative   Not on file   Social Determinants of Health   Financial Resource Strain: Not on file  Food Insecurity: Not on file  Transportation Needs: Not on file  Physical Activity: Not on file  Stress: Not on file  Social Connections: Not on file      Allergies:   Allergies  Allergen Reactions   Meloxicam Hives and Nausea Only   Norethindron-Ethinyl Estrad-Fe Hives and Rash   Other Swelling    rasberry   Sulfa Antibiotics Hives and Swelling   Sulfamethoxazole Rash    Tramadol Nausea Only    Metabolic Disorder Labs: No results found for: HGBA1C, MPG No results found for: PROLACTIN No results found for: CHOL, TRIG, HDL, CHOLHDL, VLDL, LDLCALC No results found for: TSH  Therapeutic Level Labs: No results found for: LITHIUM No results found for: CBMZ No results found for: VALPROATE  Current Medications: Current Outpatient Medications  Medication Sig Dispense Refill   fluconazole (DIFLUCAN) 150 MG tablet Take one tab by mouth as a single dose.  May repeat in 72 hours if needed. 1 tablet 1   metroNIDAZOLE (FLAGYL) 500 MG tablet Take one tab by mouth every 12 hours for 7 days. 14 tablet 0   PARoxetine (PAXIL) 10 MG tablet Take 1 tablet (10 mg total) by mouth daily. 90 tablet 0   No current facility-administered medications for this visit.    No lean or restlessness  Psychiatric Specialty Exam: Review of Systems  Cardiovascular:  Negative for chest pain.  Psychiatric/Behavioral:  Negative for substance abuse and suicidal ideas.    There were no vitals taken for this visit.There is no height or weight on file to calculate BMI.  General Appearance: Casual  Eye Contact:  Fair  Speech:  Normal Rate  Volume:  Normal  Mood: fair  Affect:  Congruent  Thought Process:  Goal Directed  Orientation:  Full (Time, Place, and Person)  Thought Content:  Logical  Suicidal Thoughts:  No  Homicidal Thoughts:  No  Memory:  Immediate;   Fair Recent;   Fair  Judgement:  Fair  Insight:  Fair  Psychomotor Activity:  Normal  Concentration:  Concentration: Fair and Attention Span: Fair  Recall:  [employer] of Knowledge:Fair  Language: Fair  Akathisia:  No  Handed:  Right  AIMS (if indicated):  not done  Assets:  Desire for Improvement  ADL's: normal  Cognition: WNL  Sleep:  Fair   Screenings: Flowsheet Row Video Visit from 01/07/2021 in [place] Video Visit from 10/08/2020 in [place] Video Visit from 07/18/2020 in [place] No Risk No Risk No Risk       Assessment and Plan: as follows  Prior documentation reviewed MDD recurrent moderate:; Doing better continue Paxil  GAD: Managing it well continue Paxil  Provided supportive therapy follow-up in 3 months or earlier if needed  Merian Capron, MD 9/19/20229:13 AM

## 2021-04-08 ENCOUNTER — Telehealth (HOSPITAL_COMMUNITY): Payer: Medicaid Other | Admitting: Psychiatry

## 2021-05-07 ENCOUNTER — Telehealth (HOSPITAL_COMMUNITY): Payer: Medicaid Other | Admitting: Psychiatry

## 2021-05-15 ENCOUNTER — Encounter (HOSPITAL_COMMUNITY): Payer: Self-pay | Admitting: Psychiatry

## 2021-05-15 ENCOUNTER — Telehealth (INDEPENDENT_AMBULATORY_CARE_PROVIDER_SITE_OTHER): Payer: Medicaid Other | Admitting: Psychiatry

## 2021-05-15 DIAGNOSIS — F410 Panic disorder [episodic paroxysmal anxiety] without agoraphobia: Secondary | ICD-10-CM | POA: Diagnosis not present

## 2021-05-15 DIAGNOSIS — F411 Generalized anxiety disorder: Secondary | ICD-10-CM

## 2021-05-15 DIAGNOSIS — F331 Major depressive disorder, recurrent, moderate: Secondary | ICD-10-CM | POA: Diagnosis not present

## 2021-05-15 MED ORDER — PAROXETINE HCL 10 MG PO TABS: 10.0000 mg | ORAL_TABLET | Freq: Every day | ORAL | 0 refills | Status: DC

## 2021-05-15 NOTE — Progress Notes (Signed)
[place] Follow up   Patient Identification: Jill Butler MRN:  [id number] Date of Evaluation:  05/15/2021 Referral Source: [facility] Chief Complaint:    depression/ anxiety   follow up  Visit Diagnosis:    ICD-10-CM   1. Major depressive disorder, recurrent episode, moderate (HCC)  F33.1     2. GAD (generalized anxiety disorder)  F41.1     3. Panic attacks  F41.0      Virtual Visit via Video Note  I connected with Chyla Foiles on 05/15/21 at  9:30 AM EST by a video enabled telemedicine application and verified that I am speaking with the correct person using two identifiers.  Location: Patient: home Provider: home office   I discussed the limitations of evaluation and management by telemedicine and the availability of in person appointments. The patient expressed understanding and agreed to proceed.     I discussed the assessment and treatment plan with the patient. The patient was provided an opportunity to ask questions and all were answered. The patient agreed with the plan and demonstrated an understanding of the instructions.   The patient was advised to call back or seek an in-person evaluation if the symptoms worsen or if the condition fails to improve as anticipated.  I provided 15 minutes of non-face-to-face time during this encounter.     History of Present Illness: Ms. Jill Butler is a 31 years old currently married Caucasian female her initial appointment was after  hospital discharge from [place] for depression in 2020, which was getting worse on celexa   Last visit was doing fair on paxil, husband has schizophrenia,  States she is not taking paxil regularly as she cannot afford has to buy her husbands med Now working at [employer], likes it  Feels want to be off med , I discussed the risk involved  Overall feels anxiety is manageable as well     Her husband has schizophrenia but he has got a supportive dog  Aggravating factors: husband sic,  husband has schizophrenia 2 kids.  Modifying factors : kids, husband No prior history of abuse or using drugs    Past Psychiatric History: depression  Previous Psychotropic Medications: Yes   Substance Abuse History in the last 12 months:  No.  Consequences of Substance Abuse: NA  Past Medical History:  Past Medical History:  Diagnosis Date   Anemia    Congenital clinodactyly     Past Surgical History:  Procedure Laterality Date   CESAREAN SECTION     x 2   COLONOSCOPY     ESOPHAGOGASTRODUODENOSCOPY     HAND SURGERY Left    3rd digit   TOE SURGERY Right    Right great toe   TUBAL LIGATION      Family Psychiatric History: brother : bipolar  Family History:  Family History  Problem Relation Age of Onset   Thyroid disease Mother    Scoliosis Mother    Diabetes Father    Hyperlipidemia Father    Hypertension Father     Social History:   Social History   Socioeconomic History   Marital status: Married    Spouse name: Not on file   Number of children: Not on file   Years of education: Not on file   Highest education level: Not on file  Occupational History   Not on file  Tobacco Use   Smoking status: Never   Smokeless tobacco: Never  Vaping Use   Vaping Use: Never used  Substance and  Sexual Activity   Alcohol use: Never   Drug use: Never   Sexual activity: Yes    Partners: Male    Birth control/protection: Surgical    Comment: tubal lig  Other Topics Concern   Not on file  Social History Narrative   Not on file   Social Determinants of Health   Financial Resource Strain: Not on file  Food Insecurity: Not on file  Transportation Needs: Not on file  Physical Activity: Not on file  Stress: Not on file  Social Connections: Not on file      Allergies:   Allergies  Allergen Reactions   Meloxicam Hives and Nausea Only   Norethindron-Ethinyl Estrad-Fe Hives and Rash   Other Swelling    rasberry   Sulfa Antibiotics Hives and Swelling    Sulfamethoxazole Rash   Tramadol Nausea Only    Metabolic Disorder Labs: No results found for: HGBA1C, MPG No results found for: PROLACTIN No results found for: CHOL, TRIG, HDL, CHOLHDL, VLDL, LDLCALC No results found for: TSH  Therapeutic Level Labs: No results found for: LITHIUM No results found for: CBMZ No results found for: VALPROATE  Current Medications: Current Outpatient Medications  Medication Sig Dispense Refill   fluconazole (DIFLUCAN) 150 MG tablet Take one tab by mouth as a single dose.  May repeat in 72 hours if needed. 1 tablet 1   metroNIDAZOLE (FLAGYL) 500 MG tablet Take one tab by mouth every 12 hours for 7 days. 14 tablet 0   PARoxetine (PAXIL) 10 MG tablet Take 1 tablet (10 mg total) by mouth daily. 90 tablet 0   No current facility-administered medications for this visit.    No lean or restlessness  Psychiatric Specialty Exam: Review of Systems  Cardiovascular:  Negative for chest pain.  Psychiatric/Behavioral:  Negative for depression, substance abuse and suicidal ideas.    There were no vitals taken for this visit.There is no height or weight on file to calculate BMI.  General Appearance: Casual  Eye Contact:  Fair  Speech:  Normal Rate  Volume:  Normal  Mood: fair  Affect:  Congruent  Thought Process:  Goal Directed  Orientation:  Full (Time, Place, and Person)  Thought Content:  Logical  Suicidal Thoughts:  No  Homicidal Thoughts:  No  Memory:  Immediate;   Fair Recent;   Fair  Judgement:  Fair  Insight:  Fair  Psychomotor Activity:  Normal  Concentration:  Concentration: Fair and Attention Span: Fair  Recall:  [employer] of Knowledge:Fair  Language: Fair  Akathisia:  No  Handed:  Right  AIMS (if indicated):  not done  Assets:  Desire for Improvement  ADL's: normal  Cognition: WNL  Sleep:  Fair   Screenings: Flowsheet Row Video Visit from 05/15/2021 in [place] Video Visit from 01/07/2021 in  [place] Video Visit from 10/08/2020 in [place] No Risk No Risk No Risk       Assessment and Plan: as follows Prior documentation reviewed  MDD recurrent moderate:; takes paxil irregular says cannot afford and wants to stop, discussed compmliance and risk , she plans to take if afford , will send refill she can call office for concerns   GAD: fluctuates but handling it fair, with irregular paxil, see above Discussed to keep paxil to prevent relapse of depression and anxiety  Provided supportive therapy  She plans to call if or when  follow up is needed  Merian Capron, MD 1/25/20239:46 AM

## 2022-11-13 LAB — HM PAP SMEAR: HM Pap smear: NORMAL

## 2024-01-29 ENCOUNTER — Encounter: Payer: Self-pay | Admitting: Physician Assistant

## 2024-01-29 ENCOUNTER — Ambulatory Visit: Payer: Self-pay | Admitting: Physician Assistant

## 2024-01-29 VITALS — BP 121/81 | HR 62 | Resp 18 | Ht 60.0 in | Wt 211.1 lb

## 2024-01-29 DIAGNOSIS — Z7689 Persons encountering health services in other specified circumstances: Secondary | ICD-10-CM

## 2024-01-29 DIAGNOSIS — Z1329 Encounter for screening for other suspected endocrine disorder: Secondary | ICD-10-CM

## 2024-01-29 DIAGNOSIS — E119 Type 2 diabetes mellitus without complications: Secondary | ICD-10-CM

## 2024-01-29 NOTE — Progress Notes (Signed)
 New Patient Office Visit  Subjective    Patient ID: Jill Butler, female    DOB: August 09, 1990  Age: 33 y.o. MRN: [id number]  CC:  Chief Complaint  Patient presents with   Establish Care    New pt     HPI Jill Butler presents to establish care  Discussed the use of AI scribe software for clinical note transcription with the patient, who gave verbal consent to proceed.  History of Present Illness Jill Butler is a 33 year old female who presents to establish care with concerns about menstrual irregularities.  She experienced a recent change in her menstrual cycle, with typically three days of heavy flow and clots about the size of a quarter or larger. Her last period was different, with only two clots of that size, and she is concerned as she is supposed to be finishing her period. She plans to monitor her next cycle to see if this pattern continues.  She has diabetes and takes pioglitazone 30 mg once daily, though she is inconsistent with her medication, sometimes missing doses for a week. Her last A1c was 6.9% in January. She monitors her blood sugar when feeling unwell.  Her medical history includes anxiety, depression, and iron deficiency anemia. She has undergone two C-sections, a tubal ligation, and wrist surgery for brachydactyly type C. Allergies include meloxicam, sulfa drugs, berries, tramadol, and penicillins.  There is a family history of diabetes and thyroid issues. She experiences symptoms such as constipation and fatigue.  She does not smoke or use drugs and drinks alcohol  infrequently, about once a month. She recently changed her insurance through her employer, [employer].    Outpatient Encounter Medications as of 01/29/2024  Medication Sig   pioglitazone (ACTOS) 30 MG tablet Take 30 mg by mouth daily.   [DISCONTINUED] escitalopram  (LEXAPRO ) 5 MG tablet Take 1 tablet (5 mg total) by mouth daily.   [DISCONTINUED] fluconazole  (DIFLUCAN ) 150 MG tablet Take one  tab by mouth as a single dose.  May repeat in 72 hours if needed.   [DISCONTINUED] metroNIDAZOLE  (FLAGYL ) 500 MG tablet Take one tab by mouth every 12 hours for 7 days.   [DISCONTINUED] PARoxetine  (PAXIL ) 10 MG tablet Take 1 tablet (10 mg total) by mouth daily.   No facility-administered encounter medications on file as of 01/29/2024.    Past Medical History:  Diagnosis Date   Anemia    Anxiety    Arthritis    Blood transfusion without reported diagnosis    2 times over 6 years ago to try and get iron in system   Congenital clinodactyly    Depression    Diabetes mellitus without complication (HCC)    GERD (gastroesophageal reflux disease)     Past Surgical History:  Procedure Laterality Date   CESAREAN SECTION     x 2   COLONOSCOPY     ESOPHAGOGASTRODUODENOSCOPY     HAND SURGERY Left    3rd digit   TOE SURGERY Right    Right great toe   TUBAL LIGATION      Family History  Problem Relation Age of Onset   Thyroid disease Mother    Scoliosis Mother    Anxiety disorder Mother    Arthritis Mother    Asthma Mother    COPD Mother    Depression Mother    Obesity Mother    Varicose Veins Mother    Diabetes Father    Hyperlipidemia Father    Hypertension Father    Depression  Father    Obesity Father    Cancer Maternal Grandmother    Varicose Veins Maternal Grandmother    Obesity Paternal Grandmother    Intellectual disability Son     Social History   Socioeconomic History   Marital status: Married    Spouse name: Not on file   Number of children: Not on file   Years of education: Not on file   Highest education level: Associate degree: academic program  Occupational History   Not on file  Tobacco Use   Smoking status: Never   Smokeless tobacco: Never  Vaping Use   Vaping status: Never Used  Substance and Sexual Activity   Alcohol  use: Yes    Alcohol /week: 1.0 standard drink of alcohol     Types: 1 Shots of liquor per week   Drug use: Never   Sexual  activity: Yes    Partners: Male    Birth control/protection: Surgical    Comment: tubal lig  Other Topics Concern   Not on file  Social History Narrative   Not on file   Social Drivers of Health   Financial Resource Strain: Medium Risk (01/29/2024)   Overall Financial Resource Strain (CARDIA)    Difficulty of Paying Living Expenses: Somewhat hard  Food Insecurity: Food Insecurity Present (01/29/2024)   Hunger Vital Sign    Worried About Running Out of Food in the Last Year: Sometimes true    Ran Out of Food in the Last Year: Sometimes true  Transportation Needs: No Transportation Needs (01/29/2024)   PRAPARE - Administrator, Civil Service (Medical): No    Lack of Transportation (Non-Medical): No  Physical Activity: Inactive (01/29/2024)   Exercise Vital Sign    Days of Exercise per Week: 0 days    Minutes of Exercise per Session: 0 min  Stress: Stress Concern Present (01/29/2024)   [employer] of Occupational Health - Occupational Stress Questionnaire    Feeling of Stress: Rather much  Social Connections: Socially Isolated (01/29/2024)   Social Connection and Isolation Panel    Frequency of Communication with Friends and Family: More than three times a week    Frequency of Social Gatherings with Friends and Family: Once a week    Attends Religious Services: Never    Database administrator or Organizations: No    Attends Engineer, structural: Not on file    Marital Status: Separated  Intimate Partner Violence: Not At Risk (01/29/2024)   Humiliation, Afraid, Rape, and Kick questionnaire    Fear of Current or Ex-Partner: No    Emotionally Abused: No    Physically Abused: No    Sexually Abused: No    Review of Systems  Constitutional:  Positive for malaise/fatigue. Negative for chills and fever.  Eyes:  Negative for blurred vision and double vision.  Respiratory:  Negative for cough and shortness of breath.   Cardiovascular:  Negative for chest  pain and palpitations.  Gastrointestinal:  Positive for constipation. Negative for abdominal pain.  Musculoskeletal:  Negative for joint pain and myalgias.  Neurological:  Negative for dizziness and headaches.        Objective    BP 121/81   Pulse 62   Resp 18   Ht 5' (1.524 m)   Wt 211 lb 1.3 oz (95.7 kg)   LMP 01/25/2024 (Exact Date)   SpO2 96%   BMI 41.22 kg/m   Physical Exam Constitutional:      Appearance: Normal appearance. She is obese.  HENT:     Head: Normocephalic.     Mouth/Throat:     Mouth: Mucous membranes are moist.     Pharynx: Oropharynx is clear.  Eyes:     Extraocular Movements: Extraocular movements intact.     Conjunctiva/sclera: Conjunctivae normal.  Cardiovascular:     Rate and Rhythm: Normal rate and regular rhythm.     Heart sounds: Normal heart sounds. No murmur heard. Pulmonary:     Effort: Pulmonary effort is normal.     Breath sounds: Normal breath sounds.  Musculoskeletal:     Right lower leg: No edema.     Left lower leg: No edema.  Skin:    General: Skin is warm and dry.  Neurological:     General: No focal deficit present.     Mental Status: She is alert and oriented to person, place, and time.  Psychiatric:        Mood and Affect: Mood normal.        Behavior: Behavior normal.       Assessment & Plan:  Encounter to establish care  Type 2 diabetes mellitus without complication, without long-term current use of insulin (HCC) Assessment & Plan: A1c at 6.9% in January with inconsistent medication adherence. Potential for elevated A1c due to lifestyle changes and non adherence to medication. Consideration of injectable medications if A1c is elevated and insurance coverage is available. She expressed fear of needles but is open to assistance from her boyfriend for administration. Discussed injectable medications like Mounjaro offer benefits such as once-weekly dosing, improved glycemic control, cardiovascular and renal protection,  and weight loss. Insurance coverage remains a concern. - update lab work today including A1c and urine ACR. - Provide educational materials on dietary management for diabetes. - Discussed potential initiation of Mounjaro if A1c is elevated. - Provided information for her to search for [employer] savings card online. - We will follow-up call on Tuesday to discuss lab results and potential medication adjustments. - Discussed cholesterol management if cholesterol levels are elevated.  Orders: -     Lipid panel -     CMP14+EGFR -     CBC with Differential/Platelet -     Hemoglobin A1c -     Microalbumin / creatinine urine ratio  Screening for thyroid disorder -     TSH + free T4    Return in about 6 months (around 07/29/2024).   Charmaine Vernida Mcnicholas, PA-C

## 2024-01-29 NOTE — Patient Instructions (Signed)
 [email or web address]

## 2024-01-29 NOTE — Assessment & Plan Note (Addendum)
 A1c at 6.9% in January with inconsistent medication adherence. Potential for elevated A1c due to lifestyle changes and non adherence to medication. Consideration of injectable medications if A1c is elevated and insurance coverage is available. She expressed fear of needles but is open to assistance from her boyfriend for administration. Discussed injectable medications like Mounjaro offer benefits such as once-weekly dosing, improved glycemic control, cardiovascular and renal protection, and weight loss. Insurance coverage remains a concern. - update lab work today including A1c and urine ACR. - Provide educational materials on dietary management for diabetes. - Discussed potential initiation of Mounjaro if A1c is elevated. - Provided information for her to search for [employer] savings card online. - We will follow-up call on Tuesday to discuss lab results and potential medication adjustments. - Discussed cholesterol management if cholesterol levels are elevated.

## 2024-01-31 LAB — TSH+FREE T4
Free T4: 1.05 ng/dL (ref 0.82–1.77)
TSH: 0.851 u[IU]/mL (ref 0.450–4.500)

## 2024-01-31 LAB — MICROALBUMIN / CREATININE URINE RATIO
Creatinine, Urine: 112.1 mg/dL
Microalb/Creat Ratio: 13 mg/g{creat} (ref 0–29)
Microalbumin, Urine: 14.3 ug/mL

## 2024-01-31 LAB — CMP14+EGFR
ALT: 12 IU/L (ref 0–32)
AST: 10 IU/L (ref 0–40)
Albumin: 4.4 g/dL (ref 4.0–5.0)
Alkaline Phosphatase: 96 IU/L (ref 41–116)
BUN/Creatinine Ratio: 15 (ref 9–23)
BUN: 9 mg/dL (ref 6–20)
Bilirubin Total: 0.3 mg/dL (ref 0.0–1.2)
CO2: 21 mmol/L (ref 20–29)
Calcium: 9.2 mg/dL (ref 8.7–10.2)
Chloride: 103 mmol/L (ref 96–106)
Creatinine, Ser: 0.62 mg/dL (ref 0.57–1.00)
Globulin, Total: 2.2 g/dL (ref 1.5–4.5)
Glucose: 118 mg/dL — ABNORMAL HIGH (ref 70–99)
Potassium: 4.2 mmol/L (ref 3.5–5.2)
Sodium: 139 mmol/L (ref 134–144)
Total Protein: 6.6 g/dL (ref 6.0–8.5)
eGFR: 123 mL/min/1.73 (ref >59)

## 2024-01-31 LAB — HEMOGLOBIN A1C
Est. average glucose Bld gHb Est-mCnc: 160 mg/dL
Hgb A1c MFr Bld: 7.2 % — ABNORMAL HIGH (ref 4.8–5.6)

## 2024-01-31 LAB — CBC WITH DIFFERENTIAL/PLATELET
Basophils Absolute: 0 x10E3/uL (ref 0.0–0.2)
Basos: 1 %
EOS (ABSOLUTE): 0.2 x10E3/uL (ref 0.0–0.4)
Eos: 4 %
Hematocrit: 39.8 % (ref 34.0–46.6)
Hemoglobin: 12.8 g/dL (ref 11.1–15.9)
Immature Grans (Abs): 0 x10E3/uL (ref 0.0–0.1)
Immature Granulocytes: 0 %
Lymphocytes Absolute: 2 x10E3/uL (ref 0.7–3.1)
Lymphs: 35 %
MCH: 28.4 pg (ref 26.6–33.0)
MCHC: 32.2 g/dL (ref 31.5–35.7)
MCV: 88 fL (ref 79–97)
Monocytes Absolute: 0.4 x10E3/uL (ref 0.1–0.9)
Monocytes: 6 %
Neutrophils Absolute: 3.1 x10E3/uL (ref 1.4–7.0)
Neutrophils: 54 %
Platelets: 269 x10E3/uL (ref 150–450)
RBC: 4.51 x10E6/uL (ref 3.77–5.28)
RDW: 13.5 % (ref 11.7–15.4)
WBC: 5.7 x10E3/uL (ref 3.4–10.8)

## 2024-01-31 LAB — LIPID PANEL
Chol/HDL Ratio: 3.8 ratio (ref 0.0–4.4)
Cholesterol, Total: 162 mg/dL (ref 100–199)
HDL: 43 mg/dL (ref >39)
LDL Chol Calc (NIH): 103 mg/dL — ABNORMAL HIGH (ref 0–99)
Triglycerides: 86 mg/dL (ref 0–149)
VLDL Cholesterol Cal: 16 mg/dL (ref 5–40)

## 2024-02-02 ENCOUNTER — Ambulatory Visit: Payer: Self-pay | Admitting: Physician Assistant

## 2024-02-02 ENCOUNTER — Other Ambulatory Visit: Payer: Self-pay | Admitting: Physician Assistant

## 2024-02-02 DIAGNOSIS — E119 Type 2 diabetes mellitus without complications: Secondary | ICD-10-CM

## 2024-02-02 MED ORDER — TIRZEPATIDE 5 MG/0.5ML ~~LOC~~ SOAJ: 5.0000 mg | SUBCUTANEOUS | 0 refills | Status: DC

## 2024-02-02 MED ORDER — TIRZEPATIDE 2.5 MG/0.5ML ~~LOC~~ SOAJ: 2.5000 mg | SUBCUTANEOUS | 0 refills | Status: DC

## 2024-02-02 MED ORDER — ROSUVASTATIN CALCIUM 10 MG PO TABS: 10.0000 mg | ORAL_TABLET | Freq: Every day | ORAL | 3 refills | Status: DC

## 2024-02-02 MED ORDER — TIRZEPATIDE 7.5 MG/0.5ML ~~LOC~~ SOAJ: 7.5000 mg | SUBCUTANEOUS | 0 refills | Status: DC

## 2024-02-04 ENCOUNTER — Other Ambulatory Visit: Payer: Self-pay | Admitting: Physician Assistant

## 2024-02-04 DIAGNOSIS — E119 Type 2 diabetes mellitus without complications: Secondary | ICD-10-CM

## 2024-02-04 MED ORDER — DAPAGLIFLOZIN PROPANEDIOL 10 MG PO TABS: ORAL_TABLET | ORAL | 2 refills | Status: DC

## 2024-02-05 ENCOUNTER — Telehealth: Payer: Self-pay

## 2024-02-05 ENCOUNTER — Other Ambulatory Visit: Payer: Self-pay | Admitting: Physician Assistant

## 2024-02-05 DIAGNOSIS — E119 Type 2 diabetes mellitus without complications: Secondary | ICD-10-CM

## 2024-02-05 NOTE — Progress Notes (Signed)
 Care Guide Pharmacy Note  02/05/2024 Name: Jill Butler MRN: [id number] DOB: [birth year]  Referred By: Mancil Pfeiffer, PA-C Reason for referral: Complex Care Management (Initial outreach to schedule referral with PharmD)   Jill Butler is a 33 y.o. year old female who is a primary care patient of [facility], Pfeiffer, [place].  Jill Butler was referred to the pharmacist for assistance related to: DMII  An unsuccessful telephone outreach was attempted today to contact the patient who was referred to the pharmacy team for assistance with medication management. Additional attempts will be made to contact the patient.  [facility]  [facility] Guide  Direct Dial: [phone]  Fax [phone]

## 2024-02-05 NOTE — Progress Notes (Signed)
 Care Guide Pharmacy Note  02/05/2024 Name: Danniel Grenz MRN: [id number] DOB: [birth year]  Referred By: Mancil Pfeiffer, PA-C Reason for referral: Complex Care Management (Outreach to schedule with Pharm d )   Jill Butler is a 33 y.o. year old female who is a primary care patient of [facility], Pfe[place], [place].  Latacha Texeira was referred to the pharmacist for assistance related to: DMII  Successful contact was made with the patient to discuss pharmacy services including being ready for the pharmacist to call at least 5 minutes before the scheduled appointment time and to have medication bottles and any blood pressure readings ready for review. The patient agreed to meet with the pharmacist via telephone visit on (date/time). 02/15/2024 Jeoffrey Buffalo , RMA     [place]  [facility], [facility] Guide  Direct Dial: [phone]  Website: [email or web address]

## 2024-02-15 ENCOUNTER — Other Ambulatory Visit (INDEPENDENT_AMBULATORY_CARE_PROVIDER_SITE_OTHER)

## 2024-02-15 ENCOUNTER — Other Ambulatory Visit (HOSPITAL_COMMUNITY): Payer: Self-pay

## 2024-02-15 DIAGNOSIS — E119 Type 2 diabetes mellitus without complications: Secondary | ICD-10-CM

## 2024-02-15 NOTE — Progress Notes (Signed)
 02/15/2024 Name: Jill Butler MRN: [id number] DOB: Jan 27, 1991  Chief Complaint  Patient presents with   Diabetes    Jill Butler is a 33 y.o. year old female who presented for a telephone visit.  I connected with  Jill Butler on 02/15/24 by telephone and verified that I am speaking with the correct person using two identifiers.  I discussed the limitations of evaluation and management by telemedicine. The patient expressed understanding and agreed to proceed.  Patient was located in her home and PharmD in PCP office during this visit.   They were referred to the pharmacist by their PCP for assistance in managing diabetes.    Subjective: Patient referred to pharmacy for medication assistance for Farxiga.  She recently aquired insurance about 6 weeks ago with her new job.  Her copay for Doreen was reported to be over $500/month on new insurance.   Care Team: Primary Care Provider: Grooms, [place], [place]   Medication Access/Adherence  Current Pharmacy:  [facility] [phone] - [place], [place] - [address] AT [facility] [address] [place] [address] Phone: [phone] Fax: [phone]  [facility] [phone] - [place], [place] - [address] AT [facility] [address] [place] [address] Phone: [phone] Fax: [phone]   Patient reports affordability concerns with their medications: Yes --appears to have high deductible Patient reports access/transportation concerns to their pharmacy: No  Patient reports adherence concerns with their medications:  No     Diabetes:  Current medications: pioglitazone for 2-3 yrs Medications tried in the past: metformin  Current glucose readings: stays well controlled, FBG msotly<130 Has multiple allergies, however she is mindful of what she eats  Current meal patterns:  The patient is asked to make an attempt to improve diet and exercise patterns to aid in medical management  of this problem. Discussed meal planning options and Plate method for healthy eating Avoid sugary drinks and desserts Incorporate balanced protein, non starchy veggies, 1 serving of carbohydrate with each meal Increase water intake Increase physical activity as able  Current physical activity: encouraged as able  Current medication access support: new insurance, but appears to have high deductible or discount savings only  Macrovascular and Microvascular Risk Reduction:  Statin? yes (rosuvastatin ); ACEi/ARB? no; no history of therapy Last urinary albumin/creatinine ratio:  Lab Results  Component Value Date   MICRALBCREAT 13 01/29/2024   Last eye exam:   Last foot exam: 01/29/2024 Tobacco Use:  Tobacco Use: Low Risk  (01/29/2024)   Patient History    Smoking Tobacco Use: Never    Smokeless Tobacco Use: Never    Passive Exposure: Not on file     Objective:  Lab Results  Component Value Date   HGBA1C 7.2 (H) 01/29/2024    Lab Results  Component Value Date   CREATININE 0.62 01/29/2024   BUN 9 01/29/2024   NA 139 01/29/2024   K 4.2 01/29/2024   CL 103 01/29/2024   CO2 21 01/29/2024    Lab Results  Component Value Date   CHOL 162 01/29/2024   HDL 43 01/29/2024   LDLCALC 103 (H) 01/29/2024   TRIG 86 01/29/2024   CHOLHDL 3.8 01/29/2024    Medications Reviewed Today     Reviewed by Butler Mliss BIRCH, [facility] (Pharmacist) on 02/15/24 at 0910  Med List Status: <None>   Medication Order Taking? Sig Documenting Provider Last Dose Status Informant    Discontinued 02/15/24 0910 (Change in therapy)  Discontinued 03/12/20 1506   pioglitazone (ACTOS) 30 MG tablet [id number]  Take 30 mg by mouth daily. [provider]  Active   rosuvastatin (CRESTOR) 10 MG tablet [id number]  Take 1 tablet (10 mg total) by mouth daily. Grooms, [place], [place]  Active               Assessment/Plan:   Diabetes: - Currently uncontrolled;A1c of 7.2% is slightly above goal A1c  <7%. Cardiorenal risk reduction is opportunities for improvement.. Blood pressure is at goal <130/80. LDL is not at goal.  - Reviewed long term cardiovascular and renal outcomes of uncontrolled blood sugar. and Reviewed goal A1c, goal fasting, and goal 2 hour post prandial glucose. Recommended to check glucose fasting at least 3 times weekly or if symptomatic. - Recommend to continue pioglitazone. -Would prefer Farxiga or GLP1 once weekly options, however Doreen copay is $583 and Ozempic copay is $970/month.  Encouraged patient to check insurance status and formulary in order for us  to optimize medication therapy.  She can ask if there are any diabetes programs within her insurance that can assist with brand name medications. -metformin intolerance updated on allergy/contraindication list  Follow Up Plan: PCP, encouraged patient to reach out once insurance benefits determined  Mliss Tarry Butler, PharmD, BCACP, CPP Clinical Pharmacist, [facility]

## 2024-02-17 ENCOUNTER — Ambulatory Visit: Payer: Self-pay | Admitting: Physician Assistant

## 2024-02-23 ENCOUNTER — Other Ambulatory Visit: Payer: Self-pay | Admitting: Physician Assistant

## 2024-02-23 ENCOUNTER — Encounter: Payer: Self-pay | Admitting: Physician Assistant

## 2024-02-23 ENCOUNTER — Other Ambulatory Visit: Payer: Self-pay

## 2024-02-23 DIAGNOSIS — N920 Excessive and frequent menstruation with regular cycle: Secondary | ICD-10-CM

## 2024-03-30 ENCOUNTER — Encounter: Payer: Self-pay | Admitting: Adult Health

## 2024-03-30 ENCOUNTER — Ambulatory Visit: Admitting: Adult Health

## 2024-03-30 VITALS — BP 120/85 | HR 87 | Ht 60.0 in | Wt 211.5 lb

## 2024-03-30 DIAGNOSIS — Z3202 Encounter for pregnancy test, result negative: Secondary | ICD-10-CM | POA: Insufficient documentation

## 2024-03-30 DIAGNOSIS — N946 Dysmenorrhea, unspecified: Secondary | ICD-10-CM | POA: Insufficient documentation

## 2024-03-30 DIAGNOSIS — N926 Irregular menstruation, unspecified: Secondary | ICD-10-CM | POA: Insufficient documentation

## 2024-03-30 DIAGNOSIS — F32A Depression, unspecified: Secondary | ICD-10-CM | POA: Insufficient documentation

## 2024-03-30 LAB — POCT URINE PREGNANCY: Preg Test, Ur: NEGATIVE

## 2024-03-30 MED ORDER — IBUPROFEN 800 MG PO TABS: 800.0000 mg | ORAL_TABLET | Freq: Three times a day (TID) | ORAL | 1 refills | Status: AC | PRN

## 2024-03-30 NOTE — Progress Notes (Signed)
 Subjective:     Patient ID: Jill Butler, female   DOB: 25-Nov-1990, 33 y.o.   MRN: [id number]  HPI Jill Butler is a 33 year old white female, separated, G2P2002 in complaining of having painful periods since August and period cycles vary now.  Last pap was 11/13/22 NILM  PCP is C Grooms  Review of Systems Having painful periods since August, like dilating to 8 Period cycles vary now Denies pain with sex Has ?? Aura at times. Reviewed past medical,surgical, social and family history. Reviewed medications and allergies.     Objective:   Physical Exam BP 120/85 (BP Location: Right Arm, Patient Position: Sitting, Cuff Size: Large)   Pulse 87   Ht 5' (1.524 m)   Wt 211 lb 8 oz (95.9 kg)   LMP 03/25/2024 (Exact Date)   BMI 41.31 kg/m  UPT is negative Skin warm and dry.Pelvic: external genitalia is normal in appearance no lesions, vagina: pink,urethra has no lesions or masses noted, cervix:smooth, uterus: normal size, shape and contour, + tender(she says tender since  C-section), no masses felt, adnexa: no masses or tenderness noted. Bladder is non tender and no masses felt.  AA is 1    03/30/2024    1:31 PM 01/29/2024    9:02 AM  Depression screen PHQ 2/9  Decreased Interest 1 1  Down, Depressed, Hopeless 1 2  PHQ - 2 Score 2 3  Altered sleeping 0 2  Tired, decreased energy 3 3  Change in appetite 3 1  Feeling bad or failure about yourself  2 3  Trouble concentrating 0 0  Moving slowly or fidgety/restless 0 0  Suicidal thoughts 0 0  PHQ-9 Score 10 12   Difficult doing work/chores  Somewhat difficult     Data saved with a previous flowsheet row definition          03/30/2024    1:32 PM 01/29/2024    9:02 AM  GAD 7 : Generalized Anxiety Score  Nervous, Anxious, on Edge 1 1  Control/stop worrying 1 2  Worry too much - different things 0 2  Trouble relaxing 1 1  Restless 0 0  Easily annoyed or irritable 3 3  Afraid - awful might happen 0 2  Total GAD 7 Score 6 11   Anxiety Difficulty  Somewhat difficult    Upstream - 03/30/24 1342       Pregnancy Intention Screening   Does the patient want to become pregnant in the next year? No    Does the patient's partner want to become pregnant in the next year? No    Would the patient like to discuss contraceptive options today? No      Contraception Wrap Up   Current Method Female Sterilization    End Method Female Sterilization    Contraception Counseling Provided No         Examination chaperoned by Clarita Salt LPN    Assessment:     1. Negative pregnancy test - POCT urine pregnancy  2. Dysmenorrhea (Primary) Has pain with periods since about August, relates to dilating to 8 Will get pelvic US  to assess uterus and ovaries Will rx motrin 800 mg  Meds ordered this encounter  Medications   ibuprofen (ADVIL) 800 MG tablet    Sig: Take 1 tablet (800 mg total) by mouth every 8 (eight) hours as needed.    Dispense:  60 tablet    Refill:  1    Supervising Provider:   JAYNE MINDER H [[id number]]    -  US  PELVIC COMPLETE WITH TRANSVAGINAL; Future  3. Irregular periods Period cycles vary  4. Anxiety and depression Declines meds or refill to [facility]     Plan:     Return in about 4 weeks for pelvic US 

## 2024-04-10 ENCOUNTER — Emergency Department (HOSPITAL_COMMUNITY): Admission: EM | Admit: 2024-04-10 | Discharge: 2024-04-10 | Disposition: A | Discharge status: Home / Self Care

## 2024-04-10 ENCOUNTER — Encounter (HOSPITAL_COMMUNITY): Payer: Self-pay

## 2024-04-10 ENCOUNTER — Other Ambulatory Visit: Payer: Self-pay

## 2024-04-10 DIAGNOSIS — R519 Headache, unspecified: Secondary | ICD-10-CM | POA: Diagnosis not present

## 2024-04-10 DIAGNOSIS — R739 Hyperglycemia, unspecified: Secondary | ICD-10-CM | POA: Diagnosis present

## 2024-04-10 DIAGNOSIS — R110 Nausea: Secondary | ICD-10-CM | POA: Insufficient documentation

## 2024-04-10 LAB — CBG MONITORING, ED: Glucose-Capillary: 170 mg/dL — ABNORMAL HIGH (ref 70–99)

## 2024-04-10 MED ORDER — METHOCARBAMOL 500 MG PO TABS
1000.0000 mg | ORAL_TABLET | Freq: Once | ORAL | Status: AC
  Administered 2024-04-10: 1000 mg via ORAL
  Filled 2024-04-10: qty 2

## 2024-04-10 NOTE — ED Triage Notes (Signed)
 Pt states her CBG was 315 at 0130 and at 0900 233. Pt states she has a headache.

## 2024-04-10 NOTE — ED Notes (Signed)
 CBG 170 in ED triage

## 2024-04-10 NOTE — Discharge Instructions (Addendum)
 Keep an eye on your blood sugar and call your primary care doctor tomorrow to discuss your medications and symptoms.  You can take Tylenol  Motrin  or over the counter Excedrin Migraine as needed for your headache.

## 2024-04-11 ENCOUNTER — Encounter (HOSPITAL_COMMUNITY): Payer: Self-pay

## 2024-04-11 ENCOUNTER — Other Ambulatory Visit: Payer: Self-pay

## 2024-04-11 ENCOUNTER — Emergency Department (HOSPITAL_COMMUNITY)
Admission: EM | Admit: 2024-04-11 | Discharge: 2024-04-11 | Disposition: A | Discharge status: Home / Self Care | Attending: Emergency Medicine | Admitting: Emergency Medicine

## 2024-04-11 DIAGNOSIS — R252 Cramp and spasm: Secondary | ICD-10-CM | POA: Diagnosis present

## 2024-04-11 DIAGNOSIS — M62838 Other muscle spasm: Secondary | ICD-10-CM | POA: Insufficient documentation

## 2024-04-11 DIAGNOSIS — Z7984 Long term (current) use of oral hypoglycemic drugs: Secondary | ICD-10-CM | POA: Diagnosis not present

## 2024-04-11 DIAGNOSIS — E119 Type 2 diabetes mellitus without complications: Secondary | ICD-10-CM | POA: Insufficient documentation

## 2024-04-11 MED ORDER — ACETAMINOPHEN 500 MG PO TABS
1000.0000 mg | ORAL_TABLET | Freq: Once | ORAL | Status: AC
  Administered 2024-04-11: 1000 mg via ORAL
  Filled 2024-04-11: qty 2

## 2024-04-11 MED ORDER — METAXALONE 800 MG PO TABS: 800.0000 mg | ORAL_TABLET | Freq: Three times a day (TID) | ORAL | 0 refills | Status: DC

## 2024-04-11 NOTE — ED Triage Notes (Signed)
 Pt arrived via [facility] form work c/o left knee spasms that began after bending over to lift a box. Pt presents in a wheel chair in Triage and reports her knee is continuously twitching with spasms.

## 2024-04-11 NOTE — ED Provider Notes (Signed)
 " [place] EMERGENCY DEPARTMENT AT [facility] Provider Note   CSN: [id number] Arrival date & time: 04/10/24  1023     Patient presents with: Hyperglycemia and Headache   Jill Butler is a 33 y.o. female.   33 year old female presents for evaluation of hyperglycemia.  She states her sugar was 350 at home.  States it spiked overnight.  She states when this happens she gets nausea and a headache as well.  Glucose here is low 100s.  She denies any other symptoms or concerns at this time.   Hyperglycemia Associated symptoms: nausea   Associated symptoms: no abdominal pain, no chest pain, no dysuria, no fever, no shortness of breath and no vomiting   Headache Associated symptoms: nausea   Associated symptoms: no abdominal pain, no back pain, no cough, no ear pain, no eye pain, no fever, no seizures, no sore throat and no vomiting        Prior to Admission medications  Medication Sig Start Date End Date Taking? Authorizing Provider  ibuprofen  (ADVIL ) 800 MG tablet Take 1 tablet (800 mg total) by mouth every 8 (eight) hours as needed. 03/30/24   Signa Delon LABOR, NP  OneTouch UltraSoft 2 Lancets MISC Use to check blood sugar one time daily. 07/15/21   [provider]  pioglitazone (ACTOS) 30 MG tablet Take 30 mg by mouth daily.    [provider]  escitalopram  (LEXAPRO ) 5 MG tablet Take 1 tablet (5 mg total) by mouth daily. 10/12/19 03/12/20  Geralene Kaiser, MD    Allergies: Meloxicam, Norethindron-ethinyl estrad-fe, Other, Penicillins, Sulfa antibiotics, Sulfamethoxazole, Metformin and related, and Tramadol    Review of Systems  Constitutional:  Negative for chills and fever.  HENT:  Negative for ear pain and sore throat.   Eyes:  Negative for pain and visual disturbance.  Respiratory:  Negative for cough and shortness of breath.   Cardiovascular:  Negative for chest pain and palpitations.  Gastrointestinal:  Positive for nausea. Negative for  abdominal pain and vomiting.  Genitourinary:  Negative for dysuria and hematuria.  Musculoskeletal:  Negative for arthralgias and back pain.  Skin:  Negative for color change and rash.  Neurological:  Positive for headaches. Negative for seizures and syncope.  All other systems reviewed and are negative.   Updated Vital Signs BP 112/85   Pulse 61   Temp 97.7 F (36.5 C) (Oral)   Resp 18   Ht 5' (1.524 m)   Wt 97.5 kg   LMP 03/25/2024 (Exact Date)   SpO2 99%   BMI 41.99 kg/m   Physical Exam Vitals and nursing note reviewed.  Constitutional:      General: She is not in acute distress.    Appearance: She is well-developed. She is not ill-appearing.  HENT:     Head: Normocephalic and atraumatic.  Eyes:     Conjunctiva/sclera: Conjunctivae normal.  Cardiovascular:     Rate and Rhythm: Normal rate and regular rhythm.     Heart sounds: No murmur heard. Pulmonary:     Effort: Pulmonary effort is normal. No respiratory distress.     Breath sounds: Normal breath sounds.  Abdominal:     Palpations: Abdomen is soft.     Tenderness: There is no abdominal tenderness.  Musculoskeletal:        General: No swelling.     Cervical back: Neck supple.  Skin:    General: Skin is warm and dry.     Capillary Refill: Capillary refill takes less  than 2 seconds.  Neurological:     Mental Status: She is alert.  Psychiatric:        Mood and Affect: Mood normal.     (all labs ordered are listed, but only abnormal results are displayed) Labs Reviewed  CBG MONITORING, ED - Abnormal; Notable for the following components:      Result Value   Glucose-Capillary 170 (*)    All other components within normal limits    EKG: None  Radiology: No results found.   Procedures   Medications Ordered in the ED  methocarbamol  (ROBAXIN ) tablet 1,000 mg (1,000 mg Oral Given 04/10/24 1335)                                    Medical Decision Making Patient here for hyperglycemia.  He is  already improved on her arrival to the ER now in the 100s.  Symptoms are improving as well and she has some nausea.  She declined any medication for this in the ER.  Give her prescription for Zofran and Robaxin  for her headache.  Advised Tylenol  and Excedrin as needed for pain and to keep a close eye on her blood sugar and otherwise follow-up with primary care in 1 to 2 weeks.  Advised to return to the ER for any new or worsening symptoms.  She feels comfortable to plan to be discharged home.  Problems Addressed: Hyperglycemia: acute illness or injury Nonintractable headache, unspecified chronicity pattern, unspecified headache type: acute illness or injury  Amount and/or Complexity of Data Reviewed External Data Reviewed: notes.    Details: Prior ED records reviewed and patient seen on 03-31-23 for fall Labs: ordered. Decision-making details documented in ED Course.    Details: Ordered and reviewed by me and glucoses in the low 100s  Risk OTC drugs. Prescription drug management.    Final diagnoses:  Nonintractable headache, unspecified chronicity pattern, unspecified headache type  Hyperglycemia    ED Discharge Orders     None          Gennaro Duwaine CROME, DO 04/11/24 9170  "

## 2024-04-11 NOTE — Discharge Instructions (Addendum)
 Today you were seen for a left knee spasm.  You should wear your knee brace for support, Tylenol  as needed, and pick up your muscle relaxer and take as needed.  Your muscle relaxer may make you drowsy, please refrain from driving until you know how you will react.  Thank you for letting us  treat you today. After performing a physical exam, I feel you are safe to go home. Please follow up with your PCP in the next several days and provide them with your records from this visit. Return to the Emergency Room if pain becomes severe or symptoms worsen.

## 2024-04-11 NOTE — ED Provider Notes (Addendum)
 " [place] EMERGENCY DEPARTMENT AT [facility] Provider Note   CSN: [id number] Arrival date & time: 04/11/24  1418     Patient presents with: Spasms   Jill Butler is a 33 y.o. female.  Past medical history significant for diabetes presents today for left knee spasm that began after bending over to lift a box.  Patient denies numbness, tingling, loss of bowel or bladder control, saddle anesthesia, any other complaints at this time.   HPI     Prior to Admission medications  Medication Sig Start Date End Date Taking? Authorizing Provider  metaxalone  (SKELAXIN ) 800 MG tablet Take 1 tablet (800 mg total) by mouth 3 (three) times daily. 04/11/24  Yes Sandie Swayze N, PA-C  ibuprofen  (ADVIL ) 800 MG tablet Take 1 tablet (800 mg total) by mouth every 8 (eight) hours as needed. 03/30/24   Signa Delon LABOR, NP  OneTouch UltraSoft 2 Lancets MISC Use to check blood sugar one time daily. 07/15/21   [provider]  pioglitazone (ACTOS) 30 MG tablet Take 30 mg by mouth daily.    [provider]  escitalopram  (LEXAPRO ) 5 MG tablet Take 1 tablet (5 mg total) by mouth daily. 10/12/19 03/12/20  Geralene Kaiser, MD    Allergies: Meloxicam, Norethindron-ethinyl estrad-fe, Other, Penicillins, Sulfa antibiotics, Sulfamethoxazole, Metformin and related, and Tramadol    Review of Systems  Musculoskeletal:  Positive for arthralgias and myalgias.    Updated Vital Signs BP 122/86 (BP Location: Right Arm)   Pulse 78   Temp 98.5 F (36.9 C) (Oral)   Resp 18   Ht 5' (1.524 m)   Wt 97.5 kg   LMP 03/25/2024 (Exact Date)   SpO2 100%   BMI 41.98 kg/m   Physical Exam Vitals and nursing note reviewed.  Constitutional:      General: She is not in acute distress.    Appearance: She is well-developed. She is not toxic-appearing.  HENT:     Head: Normocephalic and atraumatic.  Eyes:     Conjunctiva/sclera: Conjunctivae normal.  Cardiovascular:     Rate and Rhythm: Normal  rate and regular rhythm.     Pulses: Normal pulses.  Pulmonary:     Effort: Pulmonary effort is normal. No respiratory distress.  Abdominal:     Palpations: Abdomen is soft.  Musculoskeletal:        General: Tenderness present. No swelling or deformity.     Cervical back: Neck supple.     Comments: Minimal tenderness palpation of the lower abdomen, no spasm.  No laxity in the usual ligaments, deformity, warmth, or erythema.  Patient is neurovascularly intact.  Skin:    General: Skin is warm and dry.     Capillary Refill: Capillary refill takes less than 2 seconds.  Neurological:     Mental Status: She is alert.  Psychiatric:        Mood and Affect: Mood normal.     (all labs ordered are listed, but only abnormal results are displayed) Labs Reviewed - No data to display  EKG: None  Radiology: No results found.   Procedures   Medications Ordered in the ED  acetaminophen  (TYLENOL ) tablet 1,000 mg (has no administration in time range)                                    Medical Decision Making Risk OTC drugs. Prescription drug management.   This patient presents  to the ED for concern of left knee spasm differential diagnosis includes muscle spasm, fracture, dislocation, sprain, strain   Medicines ordered and prescription drug management:  I ordered medication including Tylenol  I have reviewed the patients home medicines and have made adjustments as needed   Problem List / ED Course:  Patient placed in hinged knee brace for support Considered for admission or further workup however patient's physical exam is reassuring.  Patient symptoms likely due to muscle spasm.  Patient was admitted for support until course of muscle relaxers outpatient.  Patient given return precautions.  I feel patient is safe for discharge at this time.       Final diagnoses:  Muscle spasm    ED Discharge Orders          Ordered    metaxalone  (SKELAXIN ) 800 MG tablet  3 times  daily        04/11/24 1524               Francis Ileana SAILOR, PA-C 04/11/24 1526    Francis Ileana SAILOR, PA-C 04/11/24 1533    Yolande Lamar BROCKS, MD 04/17/24 2032  "

## 2024-04-15 ENCOUNTER — Inpatient Hospital Stay: Admitting: Physician Assistant

## 2024-04-19 ENCOUNTER — Encounter: Payer: Self-pay | Admitting: Physician Assistant

## 2024-04-19 ENCOUNTER — Inpatient Hospital Stay: Admitting: Physician Assistant

## 2024-04-19 ENCOUNTER — Ambulatory Visit (INDEPENDENT_AMBULATORY_CARE_PROVIDER_SITE_OTHER): Admitting: Physician Assistant

## 2024-04-19 VITALS — BP 116/70 | Ht 60.0 in | Wt 217.0 lb

## 2024-04-19 DIAGNOSIS — F332 Major depressive disorder, recurrent severe without psychotic features: Secondary | ICD-10-CM | POA: Diagnosis not present

## 2024-04-19 DIAGNOSIS — E1165 Type 2 diabetes mellitus with hyperglycemia: Secondary | ICD-10-CM

## 2024-04-19 DIAGNOSIS — Z7984 Long term (current) use of oral hypoglycemic drugs: Secondary | ICD-10-CM

## 2024-04-19 MED ORDER — GLIMEPIRIDE 1 MG PO TABS: 1.0000 mg | ORAL_TABLET | Freq: Every day | ORAL | 1 refills | Status: DC

## 2024-04-19 NOTE — Assessment & Plan Note (Signed)
 Significant increase in symptoms. Referring to psychiatry.  Patient would like to discuss medications at her next visit.

## 2024-04-19 NOTE — Assessment & Plan Note (Signed)
 Significant hyperglycemia with blood glucose levels at 350 mg/dL. Maximum dose of Pioglitazone insufficient. Glimepiride chosen for cost-effectiveness. Discussed risks of organ damage with excessive medication. - Prescribed glimepiride once daily in the morning. - Continue Pioglitazone once daily in the morning. - Discussed importance of medication adherence.  - Maintain blood glucose log for next appointment. - Follow-up in one month to check A1c and assess medication efficacy. - Provided dietary and exercise guidance. - Referred to pharmacy team for insurance options.

## 2024-04-19 NOTE — Progress Notes (Signed)
 "  Established Patient Office Visit  Subjective   Patient ID: Jill Butler, female    DOB: 12-24-1990  Age: 33 y.o. MRN: [id number]  Chief Complaint  Patient presents with   Diabetes    Follow up    Discussed the use of AI scribe software for clinical note transcription with the patient, who gave verbal consent to proceed.  History of Present Illness Jill Butler is a 33 year old female with diabetes who presents with poorly controlled blood sugar levels.  She has home glucose readings up to 350 mg/dL. She is currently prescribed Actos 30 mg once daily, she reports she has been taking two Actos pills with each meal (up to six daily) to control her sugars. She reports readings are typically 98 to 150 mg/dL when she is taking multiple pills per meal.  She was previously Mounjaro  and Farxiga , but could not start them due to cost and insurance limitations.  She is working on diet changes, including avoiding fried foods and choosing grilled chicken, and adjusts cooking oils due to intolerance to some oils.  She works long hours at [employer] and also does [employer], which limits formal exercise, though she stays active by walking at home and at work.  Lastly, patient mentions being severely depressed and overworked as she was leaving. Requested referral to psychiatry. Wants to talk about medication at next appointment.     Review of Systems  Constitutional:  Positive for fatigue. Negative for activity change, appetite change and fever.  Gastrointestinal:  Positive for nausea. Negative for vomiting.  Neurological:  Positive for headaches. Negative for light-headedness.  Psychiatric/Behavioral:  Positive for dysphoric mood.        Objective:     Ht 5' (1.524 m)   LMP 03/25/2024 (Exact Date)   BMI 41.98 kg/m    Physical Exam Constitutional:      General: She is not in acute distress.    Appearance: Normal appearance. She is obese. She is not ill-appearing.  HENT:      Head: Normocephalic and atraumatic.     Mouth/Throat:     Mouth: Mucous membranes are moist.     Pharynx: Oropharynx is clear.  Eyes:     Extraocular Movements: Extraocular movements intact.     Conjunctiva/sclera: Conjunctivae normal.  Cardiovascular:     Rate and Rhythm: Normal rate and regular rhythm.     Heart sounds: Normal heart sounds. No murmur heard. Pulmonary:     Effort: Pulmonary effort is normal.     Breath sounds: Normal breath sounds.  Skin:    General: Skin is warm and dry.  Neurological:     General: No focal deficit present.     Mental Status: She is alert and oriented to person, place, and time.  Psychiatric:        Mood and Affect: Mood normal.        Behavior: Behavior normal.     No results found for any visits on 04/19/24.  The ASCVD Risk score (Arnett DK, et al., 2019) failed to calculate for the following reasons:   The 2019 ASCVD risk score is only valid for ages 11 to 8    Assessment & Plan:   Return in about 4 weeks (around 05/17/2024) for Sugars.   Type 2 diabetes mellitus with hyperglycemia, without long-term current use of insulin (HCC) Assessment & Plan: Significant hyperglycemia with blood glucose levels at 350 mg/dL. Maximum dose of Pioglitazone insufficient. Glimepiride chosen for cost-effectiveness. Discussed risks of  organ damage with excessive medication. - Prescribed glimepiride once daily in the morning. - Continue Pioglitazone once daily in the morning. - Discussed importance of medication adherence.  - Maintain blood glucose log for next appointment. - Follow-up in one month to check A1c and assess medication efficacy. - Provided dietary and exercise guidance. - Referred to pharmacy team for insurance options.  Orders: -     Glimepiride; Take 1 tablet (1 mg total) by mouth daily with breakfast.  Dispense: 30 tablet; Refill: 1 -     Hemoglobin A1c  MDD (major depressive disorder), recurrent severe, without psychosis  (HCC) Assessment & Plan: Significant increase in symptoms. Referring to psychiatry.  Patient would like to discuss medications at her next visit.   Orders: -     Ambulatory referral to Psychiatry   Charmaine Kyshaun Barnette, PA-C "

## 2024-04-25 ENCOUNTER — Ambulatory Visit: Payer: Self-pay

## 2024-04-25 NOTE — Telephone Encounter (Signed)
 FYI Only or Action Required?: FYI only for provider: appointment scheduled on 04/27/23.  Patient was last seen in primary care on 04/19/2024 by Grooms, [place], [place].  Called Nurse Triage reporting Facial Swelling.  Symptoms began several days ago.  Interventions attempted: Rest, hydration, or home remedies.  Symptoms are: completely resolved.  Triage Disposition: See HCP Within 4 Hours (Or PCP Triage)  Patient/caregiver understands and will follow disposition?: Yes     Copied from CRM [id number]. Topic: Clinical - Red Word Triage >> Apr 25, 2024  8:45 AM Ivette P wrote: Red Word that prompted transfer to Nurse Triage: pt having allergic reaction to medication prescribed 12/30   glimepiride  (AMARYL ) 1 MG tablet Reason for Disposition  Face swelling began after taking a drug  Answer Assessment - Initial Assessment Questions 1. ONSET: When did the swelling start? (e.g., minutes, hours, days)     Pt notes onset within first 1 -1.5 hours after dose 2. LOCATION: What part of the face is swollen? (e.g., cheek, entire face, jaw joint area, under jaw)     Entire face 3. SEVERITY: How swollen is it?     Notes severe but resolves about 10 hours after dose 4. ITCHING: Is there any itching? If Yes, ask: How much?   (Scale 1-10; mild, moderate or severe)     Itching, hives on chest 5. PAIN: Is the swelling painful to touch? If Yes, ask: How painful is it?   (Scale 0-10; mild, moderate or severe)     None 6. FEVER: Do you have a fever? If Yes, ask: What is it, how was it measured, and when did it start?      None 7. CAUSE: What do you think is causing the face swelling?     Pt believes it is related to the new medication Glimepiride  8. NEW MEDICINES: Have there been any new medicines started recently?     Glimepiride  10. OTHER SYMPTOMS: Do you have any other symptoms? (e.g., leg swelling, toothache)       Increased HR, feels like heart is in  throat  Protocols used: Face Swelling-A-AH

## 2024-04-26 ENCOUNTER — Encounter: Payer: Self-pay | Admitting: Physician Assistant

## 2024-04-26 ENCOUNTER — Ambulatory Visit (INDEPENDENT_AMBULATORY_CARE_PROVIDER_SITE_OTHER): Admitting: Physician Assistant

## 2024-04-26 VITALS — BP 117/73 | HR 79 | Temp 97.7°F | Ht 60.0 in | Wt 217.0 lb

## 2024-04-26 DIAGNOSIS — E1165 Type 2 diabetes mellitus with hyperglycemia: Secondary | ICD-10-CM

## 2024-04-26 DIAGNOSIS — Z7984 Long term (current) use of oral hypoglycemic drugs: Secondary | ICD-10-CM | POA: Diagnosis not present

## 2024-04-26 NOTE — Assessment & Plan Note (Signed)
 Patient presents today with reports of facial swelling and hives after initiation of glimepiride . Endorses hypoglycemia with the medication. Blood glucose levels variable, recent A1c 7.2%, goal <7%. - Continue Actos 30 mg oral once daily. - D/c glimepiride  and added glimepiride  to allergy list. - Maintain low carbohydrate and low sugar diet. Avoid full sugar sodas, opt for zero sugar alternatives. Increase water intake, monitor carbohydrate and added sugar intake.  - Continue working on increasing physical activity.  - Await A1c results after January 11th for further management. - Discussed referral to pharmacy team if A1c remains elevated.

## 2024-04-26 NOTE — Progress Notes (Signed)
 "  Established Patient Office Visit  Subjective   Patient ID: Jill Butler, female    DOB: Feb 14, 1991  Age: 34 y.o. MRN: [id number]  Chief Complaint  Patient presents with   Urticaria    Chest and mid breast  Started taking Glimepride    Facial Swelling    Swelling in face and neck   Allergic Reaction    Glimepride    Discussed the use of AI scribe software for clinical note transcription with the patient, who gave verbal consent to proceed.  History of Present Illness Elpidia Karn is a 34 year old female with type 2 diabetes who presents with an allergic reaction to glimepiride . After taking glimepiride  for two days, she developed facial swelling starting on her neck and progressing to her cheeks, along with hives. She has a known sulfa antibiotic allergy that was not flagged when glimepiride  was prescribed. During this reaction period she also had hypoglycemia, with blood sugar dropping to 60 mg/dL despite eating. She treated this with chocolate and cereal, and her blood sugar improved to 120 mg/dL after about 1.5 hours. She currently manages her diabetes with once-daily Actos and dietary changes focused on low-carb and low-sugar foods. Home blood sugars are typically 88 to 135 mg/dL. Her last A1c was 7.2% in October, and she is due for repeat testing. She stays physically active through work at [employer] and [employer], which involve regular walking.    Review of Systems  HENT:  Positive for facial swelling. Negative for mouth sores.   Respiratory:  Negative for apnea, choking, chest tightness, shortness of breath, wheezing and stridor.   Skin:  Positive for rash.       Itching      Objective:     BP 117/73   Pulse 79   Temp 97.7 F (36.5 C)   Ht 5' (1.524 m)   LMP 03/25/2024 (Exact Date)   SpO2 97%   BMI 42.38 kg/m    Physical Exam Constitutional:      General: She is not in acute distress.    Appearance: Normal appearance. She is obese. She is not  ill-appearing.  HENT:     Head: Normocephalic and atraumatic.     Mouth/Throat:     Mouth: Mucous membranes are moist.     Pharynx: Oropharynx is clear.  Eyes:     Extraocular Movements: Extraocular movements intact.     Conjunctiva/sclera: Conjunctivae normal.  Cardiovascular:     Rate and Rhythm: Normal rate and regular rhythm.     Heart sounds: No murmur heard. Pulmonary:     Effort: Pulmonary effort is normal. No respiratory distress.     Breath sounds: Normal breath sounds. No stridor. No wheezing.  Musculoskeletal:        General: No swelling.  Skin:    General: Skin is warm and dry.     Findings: No erythema or rash.  Neurological:     General: No focal deficit present.     Mental Status: She is alert and oriented to person, place, and time.  Psychiatric:        Mood and Affect: Mood normal.        Behavior: Behavior normal.     No results found for any visits on 04/26/24.  The ASCVD Risk score (Arnett DK, et al., 2019) failed to calculate for the following reasons:   The 2019 ASCVD risk score is only valid for ages 54 to 35    Assessment & Plan:  Return as scheduled at the end of the month.   Type 2 diabetes mellitus with hyperglycemia, without long-term current use of insulin (HCC) Assessment & Plan: Patient presents today with reports of facial swelling and hives after initiation of glimepiride . Endorses hypoglycemia with the medication. Blood glucose levels variable, recent A1c 7.2%, goal <7%. - Continue Actos 30 mg oral once daily. - D/c glimepiride  and added glimepiride  to allergy list. - Maintain low carbohydrate and low sugar diet. Avoid full sugar sodas, opt for zero sugar alternatives. Increase water intake, monitor carbohydrate and added sugar intake.  - Continue working on increasing physical activity.  - Await A1c results after January 11th for further management. - Discussed referral to pharmacy team if A1c remains elevated.     Iriana Artley, PA-C "

## 2024-04-29 ENCOUNTER — Ambulatory Visit

## 2024-04-29 DIAGNOSIS — N946 Dysmenorrhea, unspecified: Secondary | ICD-10-CM

## 2024-04-29 NOTE — Progress Notes (Signed)
 PELVIC US  TA/TV: heterogeneous anteverted uterus,posterior mid intramural fibroid 1.4 x 1.1 x 1.7 cm,posterior fundal myometrial simple cyst 6 x 5 x 5 mm,EEC 9.3 mm,normal right ovary,simple unilocular left ovarian cyst 3.2 x 3.1 x 2.5 cm,multiple simple nabothian cysts,no free fluid,ovaries appear mobile  Chaperone Tish

## 2024-05-02 ENCOUNTER — Ambulatory Visit: Payer: Self-pay | Admitting: Adult Health

## 2024-05-05 ENCOUNTER — Encounter: Payer: Self-pay | Admitting: Adult Health

## 2024-05-05 ENCOUNTER — Ambulatory Visit: Admitting: Adult Health

## 2024-05-05 VITALS — BP 127/84 | HR 74 | Ht 60.0 in | Wt 215.0 lb

## 2024-05-05 DIAGNOSIS — Z1331 Encounter for screening for depression: Secondary | ICD-10-CM

## 2024-05-05 DIAGNOSIS — Z01419 Encounter for gynecological examination (general) (routine) without abnormal findings: Secondary | ICD-10-CM | POA: Diagnosis not present

## 2024-05-05 DIAGNOSIS — D219 Benign neoplasm of connective and other soft tissue, unspecified: Secondary | ICD-10-CM | POA: Insufficient documentation

## 2024-05-05 DIAGNOSIS — N8003 Adenomyosis of the uterus: Secondary | ICD-10-CM | POA: Diagnosis not present

## 2024-05-05 DIAGNOSIS — N946 Dysmenorrhea, unspecified: Secondary | ICD-10-CM

## 2024-05-05 DIAGNOSIS — N83202 Unspecified ovarian cyst, left side: Secondary | ICD-10-CM | POA: Diagnosis not present

## 2024-05-05 DIAGNOSIS — R102 Pelvic and perineal pain unspecified side: Secondary | ICD-10-CM | POA: Insufficient documentation

## 2024-05-05 DIAGNOSIS — D251 Intramural leiomyoma of uterus: Secondary | ICD-10-CM | POA: Diagnosis not present

## 2024-05-05 DIAGNOSIS — N926 Irregular menstruation, unspecified: Secondary | ICD-10-CM

## 2024-05-05 MED ORDER — NORETHINDRONE 0.35 MG PO TABS: 1.0000 | ORAL_TABLET | Freq: Every day | ORAL | 11 refills | Status: AC

## 2024-05-05 NOTE — Progress Notes (Signed)
 Patient ID: Jill Butler, female   DOB: 10-06-1990, 34 y.o.   MRN: [id number] History of Present Illness: Jill Butler is a 34 year old white female, separated, G2P2002, in for well woman gyn exam and discuss US .  Lat pap 11/13/22 NILM   Current Medications, Allergies, Past Medical History, Past Surgical History, Family History and Social History were reviewed in Jill Butler.     Review of Systems: Patient denies any hearing loss, fatigue, blurred vision, shortness of breath, chest pain, problems with bowel movements, urination, or intercourse. No joint pain or mood swings.  Hx migraines, ?aura +pain with periods +pelvic pain Periods are irregular    Physical Exam:BP 127/84 (BP Location: Right Arm, Patient Position: Sitting, Cuff Size: Large)   Pulse 74   Ht 5' (1.524 m)   Wt 215 lb (97.5 kg)   LMP 04/20/2024 (Exact Date)   BMI 41.99 kg/m   General:  Well developed, well nourished, no acute distress Skin:  Warm and dry Neck:  Midline trachea, normal thyroid, good ROM, no lymphadenopathy Lungs; Clear to auscultation bilaterally Breast:  No dominant palpable mass, retraction, or nipple discharge Cardiovascular: Regular rate and rhythm Abdomen:  Soft, no hepatosplenomegaly, +tender, she says it is always tender Pelvic:  External genitalia is normal in appearance, no lesions.  The vagina is normal in appearance. Urethra has no lesions or masses. The cervix is bulbous.  Uterus is felt to be normal size, shape, and contour, mildly tender.  No adnexal masses, LLQ  tenderness noted.Bladder is non tender, no masses felt. Extremities/musculoskeletal:  No swelling or varicosities noted, no clubbing or cyanosis Psych:  No mood changes, alert and cooperative,seems happy Reviewed US  with her and partner: Normal uterine size and shape.  Within the uterus a small posterior intramural fibroid noted measuring 1.4 x 1.1 x 1.7cm.   A very small, less than 1cm, simple  myometrial cyst noted, may be suggestive of adenomyosis. Normal endometrium Normal right ovary.  Left ovary with simple unilocular cyst measuring 3.2 x 3.1 x 2.5 cm. Based on the US  findings, O-RADS Cat 2 - almost certainly benign   AA is 1    05/05/2024   11:40 AM 04/26/2024    8:28 AM 03/30/2024    1:31 PM  Depression screen PHQ 2/9  Decreased Interest 1 2 1   Down, Depressed, Hopeless 1 1 1   PHQ - 2 Score 2 3 2   Altered sleeping 0 2 0  Tired, decreased energy 2 2 3   Change in appetite 1 2 3   Feeling bad or failure about yourself  1 1 2   Trouble concentrating 0 0 0  Moving slowly or fidgety/restless 0 0 0  Suicidal thoughts 0 0 0  PHQ-9 Score 6 10 10   Difficult doing work/chores  Somewhat difficult    Working with PCP on meds and had [facility] referral she says    05/05/2024   11:40 AM 04/26/2024    8:28 AM 03/30/2024    1:32 PM 01/29/2024    9:02 AM  GAD 7 : Generalized Anxiety Score  Nervous, Anxious, on Edge 1 2 1 1   Control/stop worrying 3 3 1 2   Worry too much - different things 1 1 0 2  Trouble relaxing 1 3 1 1   Restless 0 0 0 0  Easily annoyed or irritable 3 3 3 3   Afraid - awful might happen 1 1 0 2  Total GAD 7 Score 10 13 6 11   Anxiety Difficulty  Somewhat difficult  Somewhat difficult      Upstream - 05/05/24 1137       Pregnancy Intention Screening   Does the patient want to become pregnant in the next year? No    Does the patient's partner want to become pregnant in the next year? No    Would the patient like to discuss contraceptive options today? No      Contraception Wrap Up   Current Method Female Sterilization    End Method Female Sterilization    Contraception Counseling Provided No          Examination chaperoned by Clarita Salt LPN  Impression and plan: 1. Encounter for gynecological examination (Primary) Physical in 1 year  2. Pelvic pain  3. Dysmenorrhea  4. Irregular periods  5. Fibroid  6. Cyst of left ovary  7. Adenomyosis of the  uterus  Discussed trying birth control pills or IUD to see if helps with her symptoms, she had rash and hives with BCP in past, will try POP, discussed with Dr Jayne and also have handout on IUD. Hysterectomy is option if hormonal therapy does not  work Will rx micronor  can start now, ir rash, stop taking  Meds ordered this encounter  Medications   norethindrone  (MICRONOR ) 0.35 MG tablet    Sig: Take 1 tablet (0.35 mg total) by mouth daily.    Dispense:  28 tablet    Refill:  11    Supervising Provider:   JAYNE MINDER H [[id number]]    Rollwo up in 3 months for ROS

## 2024-05-14 LAB — HEMOGLOBIN A1C
Est. average glucose Bld gHb Est-mCnc: 146 mg/dL
Hgb A1c MFr Bld: 6.7 % — ABNORMAL HIGH (ref 4.8–5.6)

## 2024-05-17 ENCOUNTER — Encounter: Payer: Self-pay | Admitting: Physician Assistant

## 2024-05-17 ENCOUNTER — Telehealth (INDEPENDENT_AMBULATORY_CARE_PROVIDER_SITE_OTHER): Admitting: Physician Assistant

## 2024-05-17 ENCOUNTER — Ambulatory Visit: Payer: Self-pay | Admitting: Physician Assistant

## 2024-05-17 ENCOUNTER — Ambulatory Visit: Admitting: Physician Assistant

## 2024-05-17 DIAGNOSIS — Z7984 Long term (current) use of oral hypoglycemic drugs: Secondary | ICD-10-CM | POA: Diagnosis not present

## 2024-05-17 DIAGNOSIS — E119 Type 2 diabetes mellitus without complications: Secondary | ICD-10-CM

## 2024-05-17 MED ORDER — PIOGLITAZONE HCL 30 MG PO TABS: 30.0000 mg | ORAL_TABLET | Freq: Every day | ORAL | 1 refills | Status: AC

## 2024-05-17 NOTE — Assessment & Plan Note (Signed)
 Well-controlled with an A1c of 6.7%, within goal of less than 7%. Fasting blood sugars range from 70 to 90 mg/dL, and postprandial levels remain under 200 mg/dL. Occasional hypoglycemia occurs in the mornings, typically when oversleeping or altering meal times. Current management with Actos  30 mg once daily is effective. Lifestyle modifications, including increased physical activity and dietary adjustments, are contributing positively to glycemic control. - Continue Actos  30 mg oral once daily. Refill sent to her pharmacy.  - Maintain current lifestyle modifications, including physical activity and dietary adjustments. - Return for previously scheduled follow up appointment in April.

## 2024-05-17 NOTE — Progress Notes (Signed)
 "  Virtual Visit via Video Note  I connected with Mariame Rybolt on 05/17/24 at  3:40 PM EST by a video enabled telemedicine application and verified that I am speaking with the correct person using two identifiers.  Patient Location: Home Provider Location: Office/Clinic  I discussed the limitations, risks, security, and privacy concerns of performing an evaluation and management service by video and the availability of in person appointments. I also discussed with the patient that there may be a patient responsible charge related to this service. The patient expressed understanding and agreed to proceed.  Subjective: PCP: Aviah Sorci, PA-C  Chief Complaint  Patient presents with   Diabetes    A1c follow up, no concerns     Discussed the use of AI scribe software for clinical note transcription with the patient, who gave verbal consent to proceed.  History of Present Illness Antasia Haider is a 34 year old female with diabetes who presents for a follow-up on her blood sugar management.  Recent A1c of 6.7% as of last week. Her fasting blood sugars in the morning range between 70 and 90 mg/dL, and postprandial levels do not exceed 180 mg/dL, even after meals with higher carbohydrate content. She is currently taking Actos  30 mg once daily in the morning with her first meal.  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 She incorporates short bursts of activity, such as walking or jumping jacks, while at home. Her diet includes efforts to eliminate sugars where possible, and she monitors her blood sugar levels closely, adjusting her intake of food and water based on her readings.  She experiences occasional low blood sugar levels in the mornings, particularly if she oversleeps or eats earlier than usual, but none have been 'scary low'. She manages these episodes by consuming small amounts of food with sugar if  needed.   ROS: Per HPI   Current Medications[1]  Observations/Objective: There were no vitals filed for this visit. Physical Exam Constitutional:      Appearance: Normal appearance. She is obese.  Eyes:     Extraocular Movements: Extraocular movements intact.  Pulmonary:     Effort: Pulmonary effort is normal.  Musculoskeletal:     Cervical back: Normal range of motion.  Neurological:     Mental Status: She is alert.     Assessment and Plan: Type 2 diabetes mellitus without complication, without long-term current use of insulin (HCC) Assessment & Plan: Well-controlled with an A1c of 6.7%, within goal of less than 7%. Fasting blood sugars range from 70 to 90 mg/dL, and postprandial levels remain under 200 mg/dL. Occasional hypoglycemia occurs in the mornings, typically when oversleeping or altering meal times. Current management with Actos  30 mg once daily is effective. Lifestyle modifications, including increased physical activity and dietary adjustments, are contributing positively to glycemic control. - Continue Actos  30 mg oral once daily. Refill sent to her pharmacy.  - Maintain current lifestyle modifications, including physical activity and dietary adjustments. - Return for previously scheduled follow up appointment in April.   Orders: -     Pioglitazone  HCl; Take 1 tablet (30 mg total) by mouth daily.  Dispense: 90 tablet; Refill: 1   Follow Up Instructions: Return as scheduled in April.   I discussed the assessment and treatment plan with the patient. The patient was provided an opportunity to ask questions, and all were answered. The patient agreed with the plan and demonstrated an understanding  of the instructions.   The patient was advised to call back or seek an in-person evaluation if the symptoms worsen or if the condition fails to improve as anticipated.  The above assessment and management plan was discussed with the patient. The patient verbalized  understanding of and has agreed to the management plan.   Averi Cacioppo, PA-C    [1]  Current Outpatient Medications:    ibuprofen  (ADVIL ) 800 MG tablet, Take 1 tablet (800 mg total) by mouth every 8 (eight) hours as needed., Disp: 60 tablet, Rfl: 1   norethindrone  (MICRONOR ) 0.35 MG tablet, Take 1 tablet (0.35 mg total) by mouth daily., Disp: 28 tablet, Rfl: 11   OneTouch UltraSoft 2 Lancets MISC, Use to check blood sugar one time daily., Disp: , Rfl:    pioglitazone  (ACTOS ) 30 MG tablet, Take 1 tablet (30 mg total) by mouth daily., Disp: 90 tablet, Rfl: 1  "

## 2024-05-30 ENCOUNTER — Ambulatory Visit: Admitting: Obstetrics & Gynecology

## 2024-07-29 ENCOUNTER — Ambulatory Visit: Admitting: Physician Assistant

## 2024-08-03 ENCOUNTER — Ambulatory Visit: Admitting: Adult Health

## 2024-10-06 ENCOUNTER — Ambulatory Visit (HOSPITAL_COMMUNITY): Admitting: Registered Nurse
